# Patient Record
Sex: Male | Born: 1960 | Hispanic: No | Marital: Married | Smoking: Never smoker
Health system: Southern US, Community
[De-identification: ages and names within clinical notes are randomized; demographics above are authoritative.]

## PROBLEM LIST (undated history)

## (undated) DIAGNOSIS — D179 Benign lipomatous neoplasm, unspecified: Secondary | ICD-10-CM

## (undated) DIAGNOSIS — R972 Elevated prostate specific antigen [PSA]: Secondary | ICD-10-CM

## (undated) DIAGNOSIS — I10 Essential (primary) hypertension: Secondary | ICD-10-CM

## (undated) HISTORY — DX: Essential (primary) hypertension: I10

## (undated) HISTORY — PX: PROSTATE BIOPSY: SHX241

## (undated) HISTORY — DX: Elevated prostate specific antigen (PSA): R97.20

## (undated) HISTORY — DX: Benign lipomatous neoplasm, unspecified: D17.9

---
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
## 2009-05-08 ENCOUNTER — Ambulatory Visit: Payer: Self-pay | Admitting: Internal Medicine

## 2009-05-15 ENCOUNTER — Ambulatory Visit: Payer: Self-pay | Admitting: Internal Medicine

## 2009-05-18 ENCOUNTER — Telehealth: Payer: Self-pay | Admitting: Internal Medicine

## 2009-05-21 LAB — CONVERTED CEMR LAB
ALT: 22 units/L (ref 0–53)
AST: 24 units/L (ref 0–37)
Basophils Relative: 0.5 % (ref 0.0–3.0)
Chloride: 106 meq/L (ref 96–112)
Creatinine, Ser: 0.9 mg/dL (ref 0.4–1.5)
Eosinophils Absolute: 0.1 10*3/uL (ref 0.0–0.7)
Eosinophils Relative: 3.3 % (ref 0.0–5.0)
GFR calc non Af Amer: 95.68 mL/min (ref >60)
HDL: 37.5 mg/dL — ABNORMAL LOW (ref >39.00)
LDL Cholesterol: 127 mg/dL — ABNORMAL HIGH (ref 0–99)
Lymphocytes Relative: 29.8 % (ref 12.0–46.0)
Neutrophils Relative %: 58.3 % (ref 43.0–77.0)
PSA: 8.2 ng/mL — ABNORMAL HIGH (ref 0.10–4.00)
Platelets: 244 10*3/uL (ref 150.0–400.0)
Potassium: 4.1 meq/L (ref 3.5–5.1)
RBC: 4.66 M/uL (ref 4.22–5.81)
TSH: 0.96 microintl units/mL (ref 0.35–5.50)
VLDL: 15.8 mg/dL (ref 0.0–40.0)
WBC: 4.1 10*3/uL — ABNORMAL LOW (ref 4.5–10.5)

## 2009-06-15 ENCOUNTER — Encounter: Payer: Self-pay | Admitting: Internal Medicine

## 2009-07-10 ENCOUNTER — Encounter: Payer: Self-pay | Admitting: Internal Medicine

## 2009-07-17 ENCOUNTER — Encounter: Payer: Self-pay | Admitting: Internal Medicine

## 2009-08-31 ENCOUNTER — Telehealth: Payer: Self-pay | Admitting: Internal Medicine

## 2009-09-10 ENCOUNTER — Encounter: Payer: Self-pay | Admitting: Internal Medicine

## 2009-09-18 ENCOUNTER — Encounter: Payer: Self-pay | Admitting: Internal Medicine

## 2009-10-16 ENCOUNTER — Telehealth: Payer: Self-pay | Admitting: Internal Medicine

## 2010-03-21 ENCOUNTER — Encounter: Payer: Self-pay | Admitting: Internal Medicine

## 2010-05-21 NOTE — Consult Note (Signed)
Summary: planning a bx   [facility]   Imported By: Lanelle Bal 06/25/2009 10:05:30  _____________________________________________________________________  External Attachment:    Type:   Image     Comment:   External Document

## 2010-05-21 NOTE — Progress Notes (Signed)
  Phone Note Outgoing Call Call back at [phone]   Summary of Call: labs normal except for elevated PSA [facility] yesterday and today Cornie Mccomber E. Inigo Lantigua MD  May 18, 2009 11:35 AM   Follow-up for Phone Call        labs discussed with patient we agreed to do a urology referral most likely they will repeat the PSA and take it from there Plan:  Send the patient a copy of his labs arrange urology referral  Follow-up by: [facility] E. Davarion Cuffee MD,  May 21, 2009 9:00 AM  New Problems: PSA, INCREASED (ICD-790.93)   New Problems: PSA, INCREASED (ICD-790.93)

## 2010-05-21 NOTE — Letter (Signed)
Summary: [facility]  [facility]   Imported By: Lanelle Bal 09/06/2009 12:49:08  _____________________________________________________________________  External Attachment:    Type:   Image     Comment:   External Document

## 2010-05-21 NOTE — Letter (Signed)
Summary: RTC in 6 mo for PSA DRE[facility]  [facility]   Imported By: Lanelle Bal 09/26/2009 12:59:57  _____________________________________________________________________  External Attachment:    Type:   Image     Comment:   External Document

## 2010-05-21 NOTE — Op Note (Signed)
Summary: Prostate Needle Biopsy/[facility]  Prostate Needle Biopsy/[facility]   Imported By: Lanelle Bal 09/22/2009 10:59:43  _____________________________________________________________________  External Attachment:    Type:   Image     Comment:   External Document

## 2010-05-21 NOTE — Assessment & Plan Note (Signed)
Summary: NEW TO EST//PH   Vital Signs:  Patient profile:   50 year old male Height:      74 inches Weight:      212.8 pounds BMI:     27.42 Pulse rate:   72 / minute BP sitting:   140 / 90  Vitals Entered By: Shary Decamp (May 08, 2009 12:56 PM) CC: new pt cpx Is Patient Diabetic? No   History of Present Illness: CPX last physical 3 years ago aprox. 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 CV:  Denies chest pain or discomfort and swelling of feet. Resp:  Denies cough and shortness of breath. GI:  Denies bloody stools, diarrhea, nausea, and vomiting. GU:  Denies dysuria, hematuria, urinary frequency, and urinary hesitancy. Psych:  stress ok .  Physical Exam  General:  alert, well-developed, and well-nourished.   Neck:  no masses, no thyromegaly, and normal carotid upstroke.   Lungs:  normal respiratory effort, no intercostal retractions, no accessory muscle use, and normal breath sounds.   Heart:  normal rate, regular rhythm, no murmur, and no gallop.   Abdomen:  soft, non-tender, no distention, and no masses.   Rectal:  No external abnormalities noted. Normal sphincter tone. No  rectal masses or tenderness. Hemoccult negative Prostate:  Prostate gland firm and smooth, no enlargement, nodularity, tenderness, mass, asymmetry or induration. Extremities:  no edema Psych:  Cognition and judgment appear intact. Alert and cooperative with normal attention span and concentration.    Impression & Recommendations:  Problem # 1:  ROUTINE GENERAL MEDICAL EXAM@HEALTH  CARE FACL (ICD-V70.0)  Td today flu shot : benefits  and side effects discussed.  Had one today family history unknown but will go ahead and screen  the patient with a PSA and a iFOB EKG nsr  BP 140/90 when he arrived to the office, I rechecked and  obtained 120/85 doing well, continue with his healthy lifestyle. Come back  once a year  Orders: EKG w/ Interpretation (93000)  Problem # 2:   addendum occ  difficulty with erections samples of Viagra 50 mg one or two a day a prescription was also provided side effects discussed. If so desired we can try a different medicine later on  Complete Medication List: 1)  Viagra 100 Mg Tabs (Sildenafil citrate) .Marland Kitchen.. 1 by mouth once daily as needed  Other Orders: Tdap => 49yrs IM (62130) Admin 1st Vaccine (86578)  Patient Instructions: 1)  came back fasting: 2)  CBC FLP TSH AST ALT BMP PSA  3)  Dx V70 4)  Please schedule a follow-up appointment in 1 year.  Prescriptions: VIAGRA 100 MG TABS (SILDENAFIL CITRATE) 1 by mouth once daily as needed  #  10 x 3   Entered and Authorized by:   Nolon Rod. Tanji Storrs MD   Signed by:   Nolon Rod. Unknown Schleyer MD on 05/08/2009   Method used:   Print then Give to Patient   RxID:   [id number]    Immunizations Administered:  Tetanus Vaccine:    Vaccine Type: Tdap    Site: left deltoid    Dose: 0.5 ml    Route: IM    Given by: Shary Decamp    Exp. Date: 11/04/2010    Lot #: [id number]  Not Administered:    Influenza Vaccine not given due to: declined

## 2010-05-21 NOTE — Progress Notes (Signed)
Summary: checking on the patient  Phone Note Outgoing Call   Summary of Call: two days ago  I called the patient and left a message, I was checking on him and see if he has questions Aeralyn Barna E. Ajeenah Heiny MD  October 16, 2009 8:37 AM

## 2010-05-21 NOTE — Letter (Signed)
Summary: [facility]  [facility]   Imported By: Lanelle Bal 03/29/2010 13:57:49  _____________________________________________________________________  External Attachment:    Type:   Image     Comment:   External Document

## 2010-05-21 NOTE — Progress Notes (Signed)
Summary: bx scheduled for 5/23  ---- Converted from flag ---- ---- 08/31/2009 9:29 AM, Chaynce Schafer E. Elyn Krogh MD wrote: Norman Herrlich, please call urology, get the results of the second prostate biopsy ------------------------------ requested from [facility]............ Shary Decamp  Aug 31, 2009 10:32 AM f/u bx is scheduled for 09/10/09......... Shary Decamp  Aug 31, 2009 1:51 PM

## 2010-09-24 HISTORY — PX: CLOSED MANIPULATION SHOULDER: SUR205

## 2011-01-14 ENCOUNTER — Ambulatory Visit (INDEPENDENT_AMBULATORY_CARE_PROVIDER_SITE_OTHER): Payer: Self-pay | Admitting: Internal Medicine

## 2011-01-14 ENCOUNTER — Encounter: Payer: Self-pay | Admitting: Internal Medicine

## 2011-01-14 DIAGNOSIS — R079 Chest pain, unspecified: Secondary | ICD-10-CM

## 2011-01-14 DIAGNOSIS — Z136 Encounter for screening for cardiovascular disorders: Secondary | ICD-10-CM

## 2011-01-14 DIAGNOSIS — R972 Elevated prostate specific antigen [PSA]: Secondary | ICD-10-CM

## 2011-01-14 NOTE — Assessment & Plan Note (Addendum)
Chest pain with typical and atypical features. The fact that the pain never goes away and  is somehow reproducible goes against coronary artery disease however it is located on the left and radiates to the arm. He is a nonsmoker, unknown family history, last LDL a year ago 127. EKG today : T wave inversion on V2. Plan: Labs, chest x-ray, d-dimer  stress test this week ER if symptoms severe Aspirin 81 mg once a day EKG and plan discuss with cardiology

## 2011-01-14 NOTE — Patient Instructions (Signed)
Aspirin 81 mg once daily ER he had increased pain, difficulty breathing or pain with exertion.

## 2011-01-14 NOTE — Progress Notes (Signed)
  Subjective:    Patient ID: Nicholas Carrillo, male    DOB: Dec 02, 1960, 50 y.o.   MRN: [id number]  HPI 2 weeks history of a steady  chest pain, located at the left upper quadrant of the chest, pressure-like, somehow radiates to the left arm, the pain never goes away, usually increased during times of emotional distress. He reports that for the last 2 weeks he has been very anxious. No associated nausea or diaphoresis, not worse by moving the torso or the left arm.  Past Medical History  Diagnosis Date  . Increased prostate specific antigen (PSA) velocity     prostate BX 3-11 (atypical cells) and 08-2009 negative   Past Surgical History  Procedure Date  . Hemangioma excision 1972    located at the thimus, had a sternotomy  . Knee surgery 1979    tumor not w/i the joint  . Nasal septum surgery 1980  . Prostate biopsy     x 2 ---> 2011      Review of Systems Denies any fever, cough, shortness of breath. No prolonged car or airplane trips although he takes flying lessons one hour twice a week, they are low altitude flights. Denies any pain in the legs or lower extremity swelling. No abdominal pain or GERD symptoms He used to run 3 times a week, he has stopped running 2 weeks ago as a precaution. We don't know if the chest pain is triggered by running.     Objective:   Physical Exam  Constitutional: He is oriented to person, place, and time. He appears well-developed and well-nourished. No distress.  HENT:  Head: Normocephalic and atraumatic.  Cardiovascular: Normal rate, regular rhythm and normal heart sounds.   No murmur heard.      Well-healed sternotomy scar, applying pressure at the left pectoral area makes the pain increase  Pulmonary/Chest: Effort normal and breath sounds normal. No respiratory distress. He has no wheezes. He has no rales.  Abdominal: Soft. He exhibits no distension. There is no tenderness. There is no rebound and no guarding.  Musculoskeletal: He exhibits no  edema.       Calves symmetric, no tender  Lymphadenopathy:    He has no cervical adenopathy.  Neurological: He is alert and oriented to person, place, and time.  Skin: He is not diaphoretic.  Psychiatric: He has a normal mood and affect. His behavior is normal. Judgment and thought content normal.          Assessment & Plan:

## 2011-01-15 ENCOUNTER — Ambulatory Visit (INDEPENDENT_AMBULATORY_CARE_PROVIDER_SITE_OTHER)
Admission: RE | Admit: 2011-01-15 | Discharge: 2011-01-15 | Disposition: A | Discharge status: Home / Self Care | Payer: Self-pay | Source: Ambulatory Visit | Attending: Internal Medicine | Admitting: Internal Medicine

## 2011-01-15 DIAGNOSIS — R079 Chest pain, unspecified: Secondary | ICD-10-CM

## 2011-01-15 LAB — CBC WITH DIFFERENTIAL/PLATELET
Basophils Relative: 1.8 % (ref 0.0–3.0)
Eosinophils Absolute: 0.1 10*3/uL (ref 0.0–0.7)
Eosinophils Relative: 4.1 % (ref 0.0–5.0)
Hemoglobin: 14.6 g/dL (ref 13.0–17.0)
Lymphocytes Relative: 51.8 % — ABNORMAL HIGH (ref 12.0–46.0)
MCHC: 33.9 g/dL (ref 30.0–36.0)
Monocytes Relative: 12 % (ref 3.0–12.0)
Neutro Abs: 0.9 10*3/uL — ABNORMAL LOW (ref 1.4–7.7)
Neutrophils Relative %: 30.3 % — ABNORMAL LOW (ref 43.0–77.0)
RBC: 4.73 Mil/uL (ref 4.22–5.81)
WBC: 3.1 10*3/uL — ABNORMAL LOW (ref 4.5–10.5)

## 2011-01-15 LAB — BASIC METABOLIC PANEL
CO2: 34 mEq/L — ABNORMAL HIGH (ref 19–32)
Calcium: 9.3 mg/dL (ref 8.4–10.5)
Creatinine, Ser: 1 mg/dL (ref 0.4–1.5)
Sodium: 145 mEq/L (ref 135–145)

## 2011-01-16 ENCOUNTER — Encounter (HOSPITAL_COMMUNITY): Payer: Self-pay | Admitting: Radiology

## 2011-01-27 ENCOUNTER — Ambulatory Visit (HOSPITAL_COMMUNITY): Payer: Self-pay | Attending: Internal Medicine | Admitting: Radiology

## 2011-01-27 DIAGNOSIS — R0789 Other chest pain: Secondary | ICD-10-CM

## 2011-01-27 DIAGNOSIS — R079 Chest pain, unspecified: Secondary | ICD-10-CM | POA: Insufficient documentation

## 2011-01-27 MED ORDER — TECHNETIUM TC 99M TETROFOSMIN IV KIT
33.0000 | PACK | Freq: Once | INTRAVENOUS | Status: AC | PRN
  Administered 2011-01-27: 33 via INTRAVENOUS

## 2011-01-27 MED ORDER — TECHNETIUM TC 99M TETROFOSMIN IV KIT
11.0000 | PACK | Freq: Once | INTRAVENOUS | Status: AC | PRN
  Administered 2011-01-27: 11 via INTRAVENOUS

## 2011-01-27 NOTE — Progress Notes (Signed)
[facility] 3 NUCLEAR MED [address] [place] [address] [phone]  Cardiology Nuclear Med Study  Nicholas Carrillo is a 50 y.o. male [id number] [birth year]   Nuclear Med Background Indication for Stress Test:  Evaluation for Ischemia and Abnormal EKG History:  No previous documented CAD Cardiac Risk Factors: No Risk Factors; Adopted  Symptoms:  Chest Pain/Pressure.  (last episode of chest discomfort was on 01/24/11)   Nuclear Pre-Procedure Caffeine/Decaff Intake:  None NPO After: 7:00pm   Lungs:  Clear.   IV 0.9% NS with Angio Cath:  20g  IV Site: R Antecubital  IV Started by:  Stanton Kidney, EMT-P  Chest Size (in):  43 Cup Size: n/a  Height: 6\' 2"  (1.88 m)  Weight:  206 lb (93.441 kg)  BMI:  Body mass index is 26.45 kg/(m^2). Tech Comments:  NA    Nuclear Med Study 1 or 2 day study: 1 day  Stress Test Type:  Stress  Reading MD: Charlton Haws, MD  Order Authorizing Provider:  Willow Ora, MD  Resting Radionuclide: Technetium 42m Tetrofosmin  Resting Radionuclide Dose: 11.0 mCi   Stress Radionuclide:  Technetium 89m Tetrofosmin  Stress Radionuclide Dose: 33.0 mCi           Stress Protocol Rest HR: 59 Stress HR: 184  Rest BP: 126/97 Stress BP: 181/83  Exercise Time (min): 12:00 METS: 13.7   Predicted Max HR: 171 bpm % Max HR: 107.6 bpm Rate Pressure Product: 21308   Dose of Adenosine (mg):  n/a Dose of Lexiscan: n/a mg  Dose of Atropine (mg): n/a Dose of Dobutamine: n/a mcg/kg/min (at max HR)  Stress Test Technologist: Smiley Houseman, CMA-N  Nuclear Technologist:  Domenic Polite, CNMT     Rest Procedure:  Myocardial perfusion imaging was performed at rest 45 minutes following the intravenous administration of Technetium 73m Tetrofosmin.  Rest ECG: No acute changes.  Stress Procedure:  The patient exercised for twelve minutes on the treadmill utilizing the Bruce protocol.  The patient stopped due to fatigue and denied any chest pain.  There were  no diagnostic ST-T wave changes.  Technetium 67m Tetrofosmin was injected at peak exercise and myocardial perfusion imaging was performed after a brief delay.  Stress ECG: No significant change from baseline ECG  QPS Raw Data Images:  Normal; no motion artifact; normal heart/lung ratio. Stress Images:  Normal homogeneous uptake in all areas of the myocardium. Rest Images:  Normal homogeneous uptake in all areas of the myocardium. Subtraction (SDS):  Normal Transient Ischemic Dilatation (Normal <1.22):  0.77 Lung/Heart Ratio (Normal <0.45):  0.34  Quantitative Gated Spect Images QGS EDV:  95 ml QGS ESV:  36 ml QGS cine images:  NL LV Function; NL Wall Motion QGS EF: 62%  Impression Exercise Capacity:  Excellent exercise capacity. BP Response:  Normal blood pressure response. Clinical Symptoms:  No chest pain. ECG Impression:  No significant ST segment change suggestive of ischemia. Comparison with Prior Nuclear Study: No previous nuclear study performed  Overall Impression:  Normal stress nuclear study.     Charlton Haws

## 2011-01-29 ENCOUNTER — Telehealth: Payer: Self-pay | Admitting: Internal Medicine

## 2011-01-29 DIAGNOSIS — R9389 Abnormal findings on diagnostic imaging of other specified body structures: Secondary | ICD-10-CM

## 2011-01-29 NOTE — Telephone Encounter (Signed)
stress test was negative, chest x-ray show elevated left diaphragm. I discussed the case with pulmonary, they recommended a CT of the chest to distinguish between a paralyzed diaphragm vs. a diaphragmatic hernia. If it is a hernia he will be referred to Dr. Daphine Deutscher , Gen. Surgery,  for possible correction. I left a detailed message, will scheduled a CT .

## 2011-01-30 NOTE — Telephone Encounter (Signed)
CT has been scheduled 

## 2011-01-31 ENCOUNTER — Telehealth: Payer: Self-pay | Admitting: Internal Medicine

## 2011-01-31 ENCOUNTER — Other Ambulatory Visit: Payer: Self-pay

## 2011-01-31 NOTE — Telephone Encounter (Signed)
Per call from Edward Qualia CT, patient was a Noshow for Ct today, 01-31-11.  When contacted by Okey Dupre, patient stated he hasn't seen Dr. Drue Novel in a month, would not reschedule the Ct, states he wants to talk to Dr. Drue Novel about this first.

## 2011-01-31 NOTE — Telephone Encounter (Signed)
Spoke with the patient, explained the results of that chest x-ray and stress test. Chest pain has decreased. We agreed that he will reschedule the CT  for next week. Please arrange . He will let me know if the chest pain resurface. He thinks it was anxiety.

## 2011-02-03 NOTE — Telephone Encounter (Signed)
CT has been rescheduled for 02-11-11 (per his request).  Patient is aware of appt.

## 2011-02-11 ENCOUNTER — Ambulatory Visit (INDEPENDENT_AMBULATORY_CARE_PROVIDER_SITE_OTHER)
Admission: RE | Admit: 2011-02-11 | Discharge: 2011-02-11 | Disposition: A | Discharge status: Home / Self Care | Payer: BC Managed Care – PPO | Source: Ambulatory Visit | Attending: Internal Medicine | Admitting: Internal Medicine

## 2011-02-11 DIAGNOSIS — R918 Other nonspecific abnormal finding of lung field: Secondary | ICD-10-CM

## 2011-02-11 DIAGNOSIS — R9389 Abnormal findings on diagnostic imaging of other specified body structures: Secondary | ICD-10-CM

## 2011-02-12 ENCOUNTER — Telehealth: Payer: Self-pay | Admitting: Internal Medicine

## 2011-02-12 NOTE — Telephone Encounter (Signed)
I called the patient yesterday and again today. 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 He has 2 small nodules, I recommend a CT in 12 months because he is "low risk" for bronchogenic carcinoma, he's not a smoker.

## 2011-11-12 ENCOUNTER — Telehealth: Payer: Self-pay

## 2011-11-12 NOTE — Telephone Encounter (Signed)
Misty Stanley with [facility] is checking to see if request has been received please contact to let them know pt ref# [id number] [phone]

## 2011-12-15 ENCOUNTER — Ambulatory Visit (INDEPENDENT_AMBULATORY_CARE_PROVIDER_SITE_OTHER): Payer: BC Managed Care – PPO | Admitting: Internal Medicine

## 2011-12-15 ENCOUNTER — Encounter: Payer: Self-pay | Admitting: Internal Medicine

## 2011-12-15 VITALS — BP 122/78 | HR 63 | Temp 98.0°F | Ht 75.0 in | Wt 202.0 lb

## 2011-12-15 DIAGNOSIS — Z0000 Encounter for general adult medical examination without abnormal findings: Secondary | ICD-10-CM

## 2011-12-15 DIAGNOSIS — R9389 Abnormal findings on diagnostic imaging of other specified body structures: Secondary | ICD-10-CM | POA: Insufficient documentation

## 2011-12-15 LAB — HEPATIC FUNCTION PANEL
ALT: 20 U/L (ref 0–53)
AST: 17 U/L (ref 0–37)
Albumin: 4.1 g/dL (ref 3.5–5.2)
Total Bilirubin: 1 mg/dL (ref 0.3–1.2)

## 2011-12-15 LAB — LIPID PANEL
HDL: 40.7 mg/dL (ref >39.00)
Triglycerides: 120 mg/dL (ref 0.0–149.0)
VLDL: 24 mg/dL (ref 0.0–40.0)

## 2011-12-15 LAB — LDL CHOLESTEROL, DIRECT: Direct LDL: 146.4 mg/dL

## 2011-12-15 LAB — TSH: TSH: 1.45 u[IU]/mL (ref 0.35–5.50)

## 2011-12-15 MED ORDER — SILDENAFIL CITRATE 100 MG PO TABS: 100.0000 mg | ORAL_TABLET | Freq: Every day | ORAL | Status: DC | PRN

## 2011-12-15 NOTE — Assessment & Plan Note (Signed)
Report reviewed with the patient, plan is to repeat CT in one year (October 2013)

## 2011-12-15 NOTE — Assessment & Plan Note (Signed)
Td 2011  We discussed a colonoscopy, he likes to go ahead and proceed with that. Will refer him to GI. PSA is followed by urology I recommend to continue with his healthy lifestyle. Return to the office in one year

## 2011-12-15 NOTE — Progress Notes (Signed)
  Subjective:    Patient ID: Nicholas Carrillo, male    DOB: [birth year], 51 y.o.   MRN: [id number]  HPI  CPX  Past medical history Increased PSA velocity,prostate BX 3-11 (atypical cells) and 08-2009 negative  Chest pain, 10- 2012, negative stress test Abnormal CT of the chest  01-2011, repeat in one year.  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   Cardiovascular: Negative for chest pain, palpitations and leg swelling.  Gastrointestinal: Negative for abdominal pain and blood in stool.  Genitourinary: Negative for dysuria and hematuria.       Objective:   Physical Exam  General -- alert, well-developed, and well-nourished.   Neck --no thyromegaly , normal carotid pulse Lungs -- normal respiratory effort, no intercostal retractions, no accessory muscle use, and normal breath sounds.   Heart-- normal rate, regular rhythm, no murmur, and no gallop.   Abdomen--soft, non-tender, no distention, no masses, no HSM, no guarding, and no rigidity.   Extremities-- no pretibial edema bilaterally Neurologic-- alert & oriented X3 and strength normal in all extremities. Psych-- Cognition and judgment appear intact. Alert and cooperative with normal attention span and concentration.  not anxious appearing and not depressed appearing.       Assessment & Plan:

## 2011-12-16 ENCOUNTER — Encounter: Payer: Self-pay | Admitting: Internal Medicine

## 2011-12-17 ENCOUNTER — Encounter: Payer: Self-pay | Admitting: *Deleted

## 2012-01-12 ENCOUNTER — Telehealth: Payer: Self-pay | Admitting: Internal Medicine

## 2012-01-12 DIAGNOSIS — R911 Solitary pulmonary nodule: Secondary | ICD-10-CM

## 2012-01-12 NOTE — Telephone Encounter (Signed)
Dr. Drue Novel, patient calling today, he is trying to get new life insurance, and they are questioning the last CT chest performed on 02/11/11, which showed lung nodule.  Suggested to repeat in 1 year, but patient asking if we can order to be performed now in hopes of speeding up his new insurance request approval.    Also, as fyi, patient has chosen to cancel Screening Colonoscopy for this year, as he feels the insurance may then cause him delay or want results from that also.  Says he will reschedule for 2014.    Please advise.

## 2012-01-12 NOTE — Telephone Encounter (Signed)
No problem, it just enter a order for CT. Let pt know

## 2012-01-14 ENCOUNTER — Ambulatory Visit (INDEPENDENT_AMBULATORY_CARE_PROVIDER_SITE_OTHER)
Admission: RE | Admit: 2012-01-14 | Discharge: 2012-01-14 | Disposition: A | Discharge status: Home / Self Care | Payer: BC Managed Care – PPO | Source: Ambulatory Visit | Attending: Internal Medicine | Admitting: Internal Medicine

## 2012-01-14 DIAGNOSIS — R911 Solitary pulmonary nodule: Secondary | ICD-10-CM

## 2012-01-14 NOTE — Telephone Encounter (Signed)
Patient scheduled for CT Chest today, 01/14/12, with [facility], and patient is aware.

## 2012-01-15 ENCOUNTER — Encounter: Payer: Self-pay | Admitting: *Deleted

## 2012-02-05 ENCOUNTER — Encounter: Payer: BC Managed Care – PPO | Admitting: Internal Medicine

## 2012-03-11 ENCOUNTER — Encounter: Payer: Self-pay | Admitting: Internal Medicine

## 2012-05-05 ENCOUNTER — Encounter: Payer: BC Managed Care – PPO | Admitting: Internal Medicine

## 2012-08-11 ENCOUNTER — Encounter: Payer: Self-pay | Admitting: Internal Medicine

## 2012-09-10 ENCOUNTER — Encounter: Payer: Self-pay | Admitting: Internal Medicine

## 2012-09-10 ENCOUNTER — Ambulatory Visit (AMBULATORY_SURGERY_CENTER): Payer: BC Managed Care – PPO | Admitting: *Deleted

## 2012-09-10 VITALS — Ht 74.0 in | Wt 201.2 lb

## 2012-09-10 DIAGNOSIS — Z1211 Encounter for screening for malignant neoplasm of colon: Secondary | ICD-10-CM

## 2012-09-10 MED ORDER — NA SULFATE-K SULFATE-MG SULF 17.5-3.13-1.6 GM/177ML PO SOLN: ORAL | Status: DC

## 2012-09-22 ENCOUNTER — Encounter: Payer: Self-pay | Admitting: Internal Medicine

## 2012-09-22 ENCOUNTER — Ambulatory Visit (AMBULATORY_SURGERY_CENTER): Payer: BC Managed Care – PPO | Admitting: Internal Medicine

## 2012-09-22 VITALS — BP 123/85 | HR 73 | Temp 96.7°F | Resp 15 | Ht 74.0 in | Wt 201.0 lb

## 2012-09-22 DIAGNOSIS — Z1211 Encounter for screening for malignant neoplasm of colon: Secondary | ICD-10-CM

## 2012-09-22 DIAGNOSIS — D126 Benign neoplasm of colon, unspecified: Secondary | ICD-10-CM

## 2012-09-22 HISTORY — PX: COLONOSCOPY: SHX174

## 2012-09-22 MED ORDER — SODIUM CHLORIDE 0.9 % IV SOLN: 500.0000 mL | INTRAVENOUS | Status: DC

## 2012-09-22 NOTE — Progress Notes (Signed)
Called to room to assist during endoscopic procedure.  Patient ID and intended procedure confirmed with present staff. Received instructions for my participation in the procedure from the performing physician.  

## 2012-09-22 NOTE — Op Note (Signed)
[facility] [address] [place], [address]   COLONOSCOPY PROCEDURE REPORT  PATIENT: Nicholas Carrillo, Nicholas Carrillo  MR#: [id number] BIRTHDATE: [birth year] , 51  yrs. old GENDER: Male ENDOSCOPIST: Iva Boop, MD, [facility] REFERRED XB:JYNW Drue Novel, M.D. PROCEDURE DATE:  09/22/2012 PROCEDURE:   Colonoscopy with snare polypectomy ASA CLASS:   Class I INDICATIONS:average risk screening and first colonoscopy. MEDICATIONS: Propofol (Diprivan) 280 mg IV  DESCRIPTION OF PROCEDURE:   After the risks benefits and alternatives of the procedure were thoroughly explained, informed consent was obtained.  A digital rectal exam revealed no abnormalities of the rectum and A digital rectal exam revealed no prostatic nodules.   The LB [id number]  endoscope was introduced through the anus and advanced to the cecum, which was identified by both the appendix and ileocecal valve. No adverse events experienced.   The quality of the prep was excellent using Suprep  The instrument was then slowly withdrawn as the colon was fully examined.      COLON FINDINGS: A diminutive sessile polyp was found in the sigmoid colon.  A polypectomy was performed with a cold snare.  The resection was complete and the polyp tissue was completely retrieved.   The colon mucosa was otherwise normal.   A right colon retroflexion was performed.  Retroflexed views revealed no abnormalities. The time to cecum=1 minutes 43 seconds.  Withdrawal time=11 minutes 12 seconds.  The scope was withdrawn and the procedure completed. COMPLICATIONS: There were no complications.  ENDOSCOPIC IMPRESSION: 1.   Diminutive sessile polyp was found in the sigmoid colon; polypectomy was performed with a cold snare 2.   The colon mucosa was otherwise normal - excellent prep  RECOMMENDATIONS: Timing of repeat colonoscopy will be determined by pathology findings.   eSigned:  Iva Boop, MD, [facility] 09/22/2012 11:40 AM   cc: The Patient  and Willow Ora, MD

## 2012-09-22 NOTE — Patient Instructions (Addendum)
One tiny polyp was removed from the colon. The exam was otherwise normal.  I will let you know pathology results and when to have another routine colonoscopy by mail.  I appreciate the opportunity to care for you. Iva Boop, MD, FACG   YOU HAD AN ENDOSCOPIC PROCEDURE TODAY AT THE [facility]: Refer to the procedure report that was given to you for any specific questions about what was found during the examination.  If the procedure report does not answer your questions, please call your gastroenterologist to clarify.  If you requested that your care partner not be given the details of your procedure findings, then the procedure report has been included in a sealed envelope for you to review at your convenience later.  YOU SHOULD EXPECT: Some feelings of bloating in the abdomen. Passage of more gas than usual.  Walking can help get rid of the air that was put into your GI tract during the procedure and reduce the bloating. If you had a lower endoscopy (such as a colonoscopy or flexible sigmoidoscopy) you may notice spotting of blood in your stool or on the toilet paper. If you underwent a bowel prep for your procedure, then you may not have a normal bowel movement for a few days.  DIET: Your first meal following the procedure should be a light meal and then it is ok to progress to your normal diet.  A half-sandwich or bowl of soup is an example of a good first meal.  Heavy or fried foods are harder to digest and may make you feel nauseous or bloated.  Likewise meals heavy in dairy and vegetables can cause extra gas to form and this can also increase the bloating.  Drink plenty of fluids but you should avoid alcoholic beverages for 24 hours.  ACTIVITY: Your care partner should take you home directly after the procedure.  You should plan to take it easy, moving slowly for the rest of the day.  You can resume normal activity the day after the procedure however you should NOT DRIVE or use  heavy machinery for 24 hours (because of the sedation medicines used during the test).    SYMPTOMS TO REPORT IMMEDIATELY: A gastroenterologist can be reached at any hour.  During normal business hours, 8:30 AM to 5:00 PM Monday through Friday, call [phone].  After hours and on weekends, please call the GI answering service at [phone] who will take a message and have the physician on call contact you.   Following lower endoscopy (colonoscopy or flexible sigmoidoscopy):  Excessive amounts of blood in the stool  Significant tenderness or worsening of abdominal pains  Swelling of the abdomen that is new, acute  Fever of 100F or higher  Following upper endoscopy (EGD)  Vomiting of blood or coffee ground material  New chest pain or pain under the shoulder blades  Painful or persistently difficult swallowing  New shortness of breath  Fever of 100F or higher  Black, tarry-looking stools  FOLLOW UP: If any biopsies were taken you will be contacted by phone or by letter within the next 1-3 weeks.  Call your gastroenterologist if you have not heard about the biopsies in 3 weeks.  Our staff will call the home number listed on your records the next business day following your procedure to check on you and address any questions or concerns that you may have at that time regarding the information given to you following your procedure. This is a  courtesy call and so if there is no answer at the home number and we have not heard from you through the emergency physician on call, we will assume that you have returned to your regular daily activities without incident.  SIGNATURES/CONFIDENTIALITY: You and/or your care partner have signed paperwork which will be entered into your electronic medical record.  These signatures attest to the fact that that the information above on your After Visit Summary has been reviewed and is understood.  Full responsibility of the confidentiality of this  discharge information lies with you and/or your care-partner.   Information on polyps given to you today

## 2012-09-22 NOTE — Progress Notes (Signed)
Patient did not experience any of the following events: a burn prior to discharge; a fall within the facility; wrong site/side/patient/procedure/implant event; or a hospital transfer or hospital admission upon discharge from the facility. (G8907) Patient with preoperative order for IV antibiotic SSI prophylaxis, antibiotic initiated on time. (G8916)  

## 2012-09-23 ENCOUNTER — Telehealth: Payer: Self-pay | Admitting: *Deleted

## 2012-09-23 NOTE — Telephone Encounter (Signed)
No identifier, left message, follow-up  

## 2012-09-27 ENCOUNTER — Encounter: Payer: Self-pay | Admitting: Internal Medicine

## 2012-09-27 NOTE — Progress Notes (Signed)
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

## 2015-04-10 ENCOUNTER — Telehealth: Payer: Self-pay | Admitting: Internal Medicine

## 2015-04-10 NOTE — Telephone Encounter (Signed)
Okay to re-establish, okay to put 2-30 minute appts together. Will not be able to refill until seen.

## 2015-04-10 NOTE — Telephone Encounter (Signed)
LM to notify pt he can re-est. OK to combine 2 59min slots

## 2015-04-10 NOTE — Telephone Encounter (Signed)
Pt called to see if he could get refill on meds for ED. Advised pt he has not been seen since 11/2011 and would (1) need to be approved by Dr. Larose Kells to re-establish care (2) need appt. Next new pt is at the end of march. Can I get him in for 30 in OV to re-est sooner (combine slots) if approved?

## 2015-04-13 ENCOUNTER — Encounter: Payer: Self-pay | Admitting: Internal Medicine

## 2015-04-13 ENCOUNTER — Other Ambulatory Visit: Payer: Self-pay

## 2015-04-13 ENCOUNTER — Ambulatory Visit (INDEPENDENT_AMBULATORY_CARE_PROVIDER_SITE_OTHER): Payer: BLUE CROSS/BLUE SHIELD | Admitting: Internal Medicine

## 2015-04-13 VITALS — BP 112/76 | HR 87 | Temp 97.4°F | Ht 74.0 in | Wt 207.4 lb

## 2015-04-13 DIAGNOSIS — Z0000 Encounter for general adult medical examination without abnormal findings: Secondary | ICD-10-CM

## 2015-04-13 DIAGNOSIS — I712 Thoracic aortic aneurysm, without rupture, unspecified: Secondary | ICD-10-CM

## 2015-04-13 DIAGNOSIS — Z09 Encounter for follow-up examination after completed treatment for conditions other than malignant neoplasm: Secondary | ICD-10-CM

## 2015-04-13 MED ORDER — SILDENAFIL CITRATE 20 MG PO TABS: 60.0000 mg | ORAL_TABLET | Freq: Every day | ORAL | Status: DC | PRN

## 2015-04-13 NOTE — Progress Notes (Signed)
Subjective:    Patient ID: Nicholas Carrillo, male    DOB: [birth year], 54 y.o.   MRN: [id number]  DOS:  04/13/2015 Type of visit - description : CPX, new pt Interval history: No concerns. Remains active, trying to eat healthy most of the time.    Review of Systems  Constitutional: No fever. No chills. No unexplained wt changes. No unusual sweats  HEENT: No dental problems, no ear discharge, no facial swelling, no voice changes. No eye discharge, no eye  redness , no  intolerance to light   Respiratory: No wheezing , no  difficulty breathing. No cough , no mucus production  Cardiovascular: No CP, no leg swelling , no  Palpitations  GI: no nausea, no vomiting, no diarrhea , no  abdominal pain.  No blood in the stools. No dysphagia, no odynophagia    Endocrine: No polyphagia, no polyuria , no polydipsia  GU: No dysuria, gross hematuria, difficulty urinating. No urinary urgency, no frequency.  Musculoskeletal: No joint swellings or unusual aches or pains  Skin: No change in the color of the skin, palor , no  Rash  Allergic, immunologic: No environmental allergies , no  food allergies  Neurological: No dizziness no  syncope. No headaches. No diplopia, no slurred, no slurred speech, no motor deficits, no facial  Numbness  Hematological: No enlarged lymph nodes, no easy bruising , no unusual bleedings  Psychiatry: No suicidal ideas, no hallucinations, no beavior problems, no confusion.  No unusual/severe anxiety, no depression   Past Medical History  Diagnosis Date  . Increased prostate specific antigen (PSA) velocity     prostate BX 3-11 (atypical cells) and 08-2009 negative    Past Surgical History  Procedure Laterality Date  . Hemangioma excision  1972    located at the thimus, had a sternotomy  . Knee surgery  1979    tumor not w/i the joint  . Nasal septum surgery  1980  . Prostate biopsy      x 2 ---> 2011  . Closed manipulation shoulder Right 09/24/10    Social  History   Social History  . Marital Status: Married    Spouse Name: N/A  . Number of Children: 2  . Years of Education: N/A   Occupational History  . lawyer    Social History Main Topics  . Smoking status: Never Smoker   . Smokeless tobacco: Never Used  . Alcohol Use: 3.0 oz/week    5 Glasses of wine per week  . Drug Use: No  . Sexual Activity: Not on file   Other Topics Concern  . Not on file   Social History Narrative   Unknown Family Hx-Adopted.   Occupation: Forensic psychologist   Married, 2 children   Diet--try to eat healthy   Regular Exercise---3x wk treadmill, walk for 40 minutes              Family History  Problem Relation Age of Onset  . Adopted: Yes       Medication List       This list is accurate as of: 04/13/15 11:59 PM.  Always use your most recent med list.               sildenafil 20 MG tablet  Commonly known as:  REVATIO  Take 3-4 tablets (60-80 mg total) by mouth daily as needed.           Objective:   Physical Exam BP 112/76 mmHg  Pulse 87  Temp(Src) 97.4 F (36.3 C) (Oral)  Ht 6\' 2"  (1.88 m)  Wt 207 lb 6 oz (94.065 kg)  BMI 26.61 kg/m2  SpO2 96% General:   Well developed, well nourished . NAD.  Neck: No  thyromegaly , normal carotid pulse HEENT:  Normocephalic . Face symmetric, atraumatic Lungs:  CTA B Normal respiratory effort, no intercostal retractions, no accessory muscle use. Heart: RRR,  no murmur.  No pretibial edema bilaterally  Abdomen:  Not distended, soft, non-tender. No rebound or rigidity.   Skin: Exposed areas without rash. Not pale. Not jaundice Neurologic:  alert & oriented X3.  Speech normal, gait appropriate for age and unassisted Strength symmetric and appropriate for age.  Psych: Cognition and judgment appear intact.  Cooperative with normal attention span and concentration.  Behavior appropriate. No anxious or depressed appearing.     Assessment & Plan:   Assessment Increased PSA, BX 06-2009:  atypical cells. BX again 08-2009 (-) Abnormal CT chest: --Nodules  : Stable per CT 2013, no further CTs if risk is low  --Elevated diaphragma, chronic --Mild ascending aortic aneurysm ED   Plan: [facility] call radiology about repeating a CT given aneurysm. Addendum-- spoke w/ Dr Jobe Igo regards Ao aneurysm , current guidelines suggest repeat a CTA or MRA (w/ contrast ideally ---> will set up and let pt know).  J P 04-18-2015

## 2015-04-13 NOTE — Patient Instructions (Addendum)
BEFORE YOU LEAVE THE OFFICE:   GO TO THE FRONT DESK Schedule labs to be done within few days (fasting)    Schedule a complete physical exam to be done in one year  Please be fasting  We can also see you in the afternoon and check labs the next day

## 2015-04-13 NOTE — Progress Notes (Signed)
Pre visit review using our clinic review tool, if applicable. No additional management support is needed unless otherwise documented below in the visit note. 

## 2015-04-13 NOTE — Assessment & Plan Note (Addendum)
Td 2011  Flu shot-- declined CCS: colonoscopy 2014, next 10 years per GI letter PSA is followed by urology (no records) I recommend to continue with his healthy lifestyle. Return to the office in one year

## 2015-04-17 ENCOUNTER — Other Ambulatory Visit (INDEPENDENT_AMBULATORY_CARE_PROVIDER_SITE_OTHER): Payer: BLUE CROSS/BLUE SHIELD

## 2015-04-17 DIAGNOSIS — Z0000 Encounter for general adult medical examination without abnormal findings: Secondary | ICD-10-CM

## 2015-04-17 LAB — COMPREHENSIVE METABOLIC PANEL
ALK PHOS: 39 U/L (ref 39–117)
ALT: 22 U/L (ref 0–53)
AST: 18 U/L (ref 0–37)
Albumin: 4.4 g/dL (ref 3.5–5.2)
BILIRUBIN TOTAL: 1 mg/dL (ref 0.2–1.2)
BUN: 14 mg/dL (ref 6–23)
CHLORIDE: 102 meq/L (ref 96–112)
CO2: 32 meq/L (ref 19–32)
CREATININE: 1.09 mg/dL (ref 0.40–1.50)
Calcium: 9.7 mg/dL (ref 8.4–10.5)
GFR: 74.92 mL/min (ref >60.00)
GLUCOSE: 105 mg/dL — AB (ref 70–99)
Potassium: 4.8 mEq/L (ref 3.5–5.1)
SODIUM: 141 meq/L (ref 135–145)
Total Protein: 7.1 g/dL (ref 6.0–8.3)

## 2015-04-17 LAB — CBC WITH DIFFERENTIAL/PLATELET
BASOS ABS: 0.1 10*3/uL (ref 0.0–0.1)
Basophils Relative: 0.9 % (ref 0.0–3.0)
EOS PCT: 3.3 % (ref 0.0–5.0)
Eosinophils Absolute: 0.2 10*3/uL (ref 0.0–0.7)
HEMATOCRIT: 47.6 % (ref 39.0–52.0)
HEMOGLOBIN: 16.2 g/dL (ref 13.0–17.0)
LYMPHS PCT: 26.3 % (ref 12.0–46.0)
Lymphs Abs: 1.5 10*3/uL (ref 0.7–4.0)
MCHC: 34 g/dL (ref 30.0–36.0)
MCV: 89.8 fl (ref 78.0–100.0)
MONOS PCT: 7 % (ref 3.0–12.0)
Monocytes Absolute: 0.4 10*3/uL (ref 0.1–1.0)
NEUTROS PCT: 62.5 % (ref 43.0–77.0)
Neutro Abs: 3.5 10*3/uL (ref 1.4–7.7)
Platelets: 287 10*3/uL (ref 150.0–400.0)
RBC: 5.3 Mil/uL (ref 4.22–5.81)
RDW: 13.3 % (ref 11.5–15.5)
WBC: 5.5 10*3/uL (ref 4.0–10.5)

## 2015-04-17 LAB — LIPID PANEL
CHOL/HDL RATIO: 6
Cholesterol: 241 mg/dL — ABNORMAL HIGH (ref 0–200)
HDL: 41.4 mg/dL (ref >39.00)
LDL Cholesterol: 170 mg/dL — ABNORMAL HIGH (ref 0–99)
NONHDL: 199.15
Triglycerides: 146 mg/dL (ref 0.0–149.0)
VLDL: 29.2 mg/dL (ref 0.0–40.0)

## 2015-04-17 LAB — TSH: TSH: 1.19 u[IU]/mL (ref 0.35–4.50)

## 2015-04-18 ENCOUNTER — Encounter: Payer: Self-pay | Admitting: Internal Medicine

## 2015-04-18 DIAGNOSIS — Z09 Encounter for follow-up examination after completed treatment for conditions other than malignant neoplasm: Secondary | ICD-10-CM | POA: Insufficient documentation

## 2015-04-18 NOTE — Assessment & Plan Note (Signed)
We'll call radiology about repeating a CT given aneurysm. Addendum-- spoke w/ Dr Jobe Igo regards Ao aneurysm , current guidelines suggest repeat a CTA or MRA (w/ contrast ideally ---> will set up and let pt know).  J P 04-18-2015

## 2015-04-19 ENCOUNTER — Ambulatory Visit (HOSPITAL_BASED_OUTPATIENT_CLINIC_OR_DEPARTMENT_OTHER)
Admission: RE | Admit: 2015-04-19 | Discharge: 2015-04-19 | Disposition: A | Discharge status: Home / Self Care | Payer: BLUE CROSS/BLUE SHIELD | Source: Ambulatory Visit | Attending: Internal Medicine | Admitting: Internal Medicine

## 2015-04-19 DIAGNOSIS — I712 Thoracic aortic aneurysm, without rupture, unspecified: Secondary | ICD-10-CM

## 2015-04-19 MED ORDER — IOHEXOL 350 MG/ML SOLN
100.0000 mL | Freq: Once | INTRAVENOUS | Status: AC | PRN
  Administered 2015-04-19: 100 mL via INTRAVENOUS

## 2015-12-18 ENCOUNTER — Encounter: Payer: Self-pay | Admitting: Internal Medicine

## 2015-12-18 ENCOUNTER — Ambulatory Visit (INDEPENDENT_AMBULATORY_CARE_PROVIDER_SITE_OTHER): Payer: BLUE CROSS/BLUE SHIELD | Admitting: Internal Medicine

## 2015-12-18 VITALS — BP 118/74 | HR 74 | Temp 98.1°F | Resp 14 | Ht 74.0 in | Wt 217.0 lb

## 2015-12-18 DIAGNOSIS — D179 Benign lipomatous neoplasm, unspecified: Secondary | ICD-10-CM

## 2015-12-18 NOTE — Progress Notes (Signed)
   Subjective:    Patient ID: Nicholas Carrillo, male    DOB: Dec 23, 1960, 55 y.o.   MRN: [id number]  DOS:  12/18/2015 Type of visit - description : Acute visit Interval history: Long history of lipomas,  lately the one @ the left arm seems to be bigger and the one at the nuchal area is bothering him when he is laying down reading.    Review of Systems  Denies any itching, bleeding or skin changes @ the lipomas  Past Medical History:  Diagnosis Date  . Increased prostate specific antigen (PSA) velocity    prostate BX 3-11 (atypical cells) and 08-2009 negative    Past Surgical History:  Procedure Laterality Date  . CLOSED MANIPULATION SHOULDER Right 09/24/10  . [place]   located at the thimus, had a sternotomy  . KNEE SURGERY  1979   tumor not w/i the joint  . NASAL SEPTUM SURGERY  1980  . PROSTATE BIOPSY     x 2 ---> 2011    Social History   Social History  . Marital status: Married    Spouse name: N/A  . Number of children: 2  . Years of education: N/A   Occupational History  . lawyer    Social History Main Topics  . Smoking status: Never Smoker  . Smokeless tobacco: Never Used  . Alcohol use 3.0 oz/week    5 Glasses of wine per week  . Drug use: No  . Sexual activity: Not on file   Other Topics Concern  . Not on file   Social History Narrative   Unknown Family Hx-Adopted.   Occupation: Attorney   Married, 2 children   Diet--try to eat healthy   Regular Exercise---3x wk treadmill, walk for 40 minutes                 Medication List       Accurate as of 12/18/15 11:59 PM. Always use your most recent med list.          sildenafil 20 MG tablet Commonly known as:  REVATIO Take 3-4 tablets (60-80 mg total) by mouth daily as needed.          Objective:   Physical Exam  Skin:      BP 118/74 (BP Location: Left Arm, Patient Position: Sitting, Cuff Size: Normal)   Pulse 74   Temp 98.1 F (36.7 C) (Oral)   Resp 14   Ht 6\' 2"   (1.88 m)   Wt 217 lb (98.4 kg)   SpO2 98%   BMI 27.86 kg/m      Assessment & Plan:   Assessment Increased PSA, BX 06-2009: atypical cells. BX again 08-2009 (-) Abnormal CT chest: --Nodules  : Stable per CT 2013, no further CTs if risk is low  --Elevated diaphragma, chronic --Mild ascending aortic aneurysm: CT 04/10/2015 stable. Lipomas, multiple, had 2 biopsies when he was a teenager, negative ED   PLAN: Lipomas: Multiple lipomas, the one in the left arm has increased in size according to the patient, they one at the nuchal area is bothersome. Refer to Gen. surgery for consideration of excision.

## 2015-12-18 NOTE — Progress Notes (Signed)
Pre visit review using our clinic review tool, if applicable. No additional management support is needed unless otherwise documented below in the visit note. 

## 2015-12-18 NOTE — Patient Instructions (Signed)
Will refer you to a surgeon

## 2015-12-19 NOTE — Assessment & Plan Note (Signed)
Lipomas: Multiple lipomas, the one in the left arm has increased in size according to the patient, they one at the nuchal area is bothersome. Refer to Gen. surgery for consideration of excision.

## 2016-01-01 DIAGNOSIS — D170 Benign lipomatous neoplasm of skin and subcutaneous tissue of head, face and neck: Secondary | ICD-10-CM | POA: Diagnosis not present

## 2016-01-01 DIAGNOSIS — D1720 Benign lipomatous neoplasm of skin and subcutaneous tissue of unspecified limb: Secondary | ICD-10-CM | POA: Diagnosis not present

## 2016-03-03 ENCOUNTER — Other Ambulatory Visit: Payer: Self-pay | Admitting: General Surgery

## 2016-03-03 DIAGNOSIS — D1722 Benign lipomatous neoplasm of skin and subcutaneous tissue of left arm: Secondary | ICD-10-CM | POA: Diagnosis not present

## 2016-03-03 DIAGNOSIS — D179 Benign lipomatous neoplasm, unspecified: Secondary | ICD-10-CM | POA: Diagnosis not present

## 2016-03-03 DIAGNOSIS — D1721 Benign lipomatous neoplasm of skin and subcutaneous tissue of right arm: Secondary | ICD-10-CM | POA: Diagnosis not present

## 2016-03-03 DIAGNOSIS — D170 Benign lipomatous neoplasm of skin and subcutaneous tissue of head, face and neck: Secondary | ICD-10-CM | POA: Diagnosis not present

## 2016-03-03 DIAGNOSIS — M7989 Other specified soft tissue disorders: Secondary | ICD-10-CM | POA: Diagnosis not present

## 2016-04-30 ENCOUNTER — Other Ambulatory Visit: Payer: Self-pay | Admitting: Internal Medicine

## 2016-05-01 ENCOUNTER — Other Ambulatory Visit: Payer: Self-pay | Admitting: Internal Medicine

## 2016-09-09 ENCOUNTER — Encounter: Payer: Self-pay | Admitting: Internal Medicine

## 2016-09-10 ENCOUNTER — Encounter: Payer: Self-pay | Admitting: Internal Medicine

## 2017-07-13 ENCOUNTER — Ambulatory Visit (INDEPENDENT_AMBULATORY_CARE_PROVIDER_SITE_OTHER): Payer: BLUE CROSS/BLUE SHIELD | Admitting: Internal Medicine

## 2017-07-13 ENCOUNTER — Encounter: Payer: Self-pay | Admitting: Internal Medicine

## 2017-07-13 VITALS — BP 124/66 | HR 79 | Temp 97.9°F | Resp 14 | Ht 74.0 in | Wt 186.5 lb

## 2017-07-13 DIAGNOSIS — R197 Diarrhea, unspecified: Secondary | ICD-10-CM

## 2017-07-13 DIAGNOSIS — Z125 Encounter for screening for malignant neoplasm of prostate: Secondary | ICD-10-CM

## 2017-07-13 DIAGNOSIS — R634 Abnormal weight loss: Secondary | ICD-10-CM | POA: Diagnosis not present

## 2017-07-13 DIAGNOSIS — Z0000 Encounter for general adult medical examination without abnormal findings: Secondary | ICD-10-CM

## 2017-07-13 NOTE — Patient Instructions (Signed)
GO TO THE LAB :  Get the blood work   Also please get containers bring samples of loose stools at your earliest [place] Schedule your next appointment for a checkup in 3 months

## 2017-07-13 NOTE — Progress Notes (Signed)
Pre visit review using our clinic review tool, if applicable. No additional management support is needed unless otherwise documented below in the visit note. 

## 2017-07-13 NOTE — Progress Notes (Signed)
Subjective:    Patient ID: Nicholas Carrillo, male    DOB: 08/12/1960, 57 y.o.   MRN: [id number]  DOS:  07/13/2017 Type of visit - description : cpx Interval history: We also discussed other issues: Went to [place] April 2018, he got sick with nausea, vomiting and diarrhea, self prescribed Cipro. He came back to the [place] and continue with diarrhea without abdominal pain, fever, chills or blood in the stools. He had several episodes a day and the stools range from watery to loose. The sx lasted at least 3 months, during that timeframe he lost 30 pounds. Eventually he visited [place] in July 2018 and a friend recommended Nitazoxanida that he got w/o a prescription and took it for several days. Since then, his diarrhea has improved, he now has loose stools maybe once a week but at other times his stools are normal.    Wt Readings from Last 3 Encounters:  07/13/17 186 lb 8 oz (84.6 kg)  12/18/15 217 lb (98.4 kg)  04/13/15 207 lb 6 oz (94.1 kg)      Review of Systems Denies chest pain, difficulty breathing, palpitation. + Stress, work-related, has teenager children.   Other than above, a 14 point review of systems is negative    Past Medical History:  Diagnosis Date  . Increased prostate specific antigen (PSA) velocity    prostate BX 3-11 (atypical cells) and 08-2009 negative  . Multiple lipomas     Past Surgical History:  Procedure Laterality Date  . CLOSED MANIPULATION SHOULDER Right 09/24/10  . [place]   located at the thimus, had a sternotomy  . KNEE SURGERY  1979   tumor not w/i the joint  . NASAL SEPTUM SURGERY  1980  . PROSTATE BIOPSY     x 2 ---> 2011    Social History   Socioeconomic History  . Marital status: Married    Spouse name: Not on file  . Number of children: 2  . Years of education: Not on file  . Highest education level: Not on file  Occupational History  . Occupation: Chief Executive Officer  Social Needs  . Financial resource strain: Not on  file  . Food insecurity:    Worry: Not on file    Inability: Not on file  . Transportation needs:    Medical: Not on file    Non-medical: Not on file  Tobacco Use  . Smoking status: Never Smoker  . Smokeless tobacco: Never Used  Substance and Sexual Activity  . Alcohol use: Yes    Alcohol/week: 3.0 oz    Types: 5 Glasses of wine per week  . Drug use: No  . Sexual activity: Not on file  Lifestyle  . Physical activity:    Days per week: Not on file    Minutes per session: Not on file  . Stress: Not on file  Relationships  . Social connections:    Talks on phone: Not on file    Gets together: Not on file    Attends religious service: Not on file    Active member of club or organization: Not on file    Attends meetings of clubs or organizations: Not on file    Relationship status: Not on file  . Intimate partner violence:    Fear of current or ex partner: Not on file    Emotionally abused: Not on file    Physically abused: Not on file    Forced sexual activity: Not on file  Other Topics Concern  . Not on file  Social History Narrative   Unknown Family Hx-Adopted.   Occupation: Forensic psychologist   Married, 2 children   Diet--try to eat healthy   Regular Exercise---3x wk treadmill, walk for 40 minutes           Family History  Adopted: Yes     Allergies as of 07/13/2017   No Known Allergies     Medication List        Accurate as of 07/13/17 11:59 PM. Always use your most recent med list.          sildenafil 20 MG tablet Commonly known as:  REVATIO Take 3-4 tablets by mouth daily as needed.          Objective:   Physical Exam BP 124/66 (BP Location: Right Arm, Patient Position: Sitting, Cuff Size: Small)   Pulse 79   Temp 97.9 F (36.6 C) (Oral)   Resp 14   Ht 6\' 2"  (1.88 m)   Wt 186 lb 8 oz (84.6 kg)   SpO2 96%   BMI 23.95 kg/m  General:   Well developed, well nourished . NAD.  Neck: No  thyromegaly  HEENT:  Normocephalic . Face symmetric,  atraumatic Lungs:  CTA B Normal respiratory effort, no intercostal retractions, no accessory muscle use. Heart: RRR,  no murmur.  No pretibial edema bilaterally  Abdomen:  Not distended, soft, non-tender. No rebound or rigidity.   Rectal: External abnormalities: none. Normal sphincter tone. No rectal masses or tenderness.  No stools found Prostate: Prostate gland firm and smooth,  prostate quite enlarged but soft, nontender, nonnodular.   Skin: Exposed areas without rash. Not pale. Not jaundice Neurologic:  alert & oriented X3.  Speech normal, gait appropriate for age and unassisted Strength symmetric and appropriate for age.  Psych: Cognition and judgment appear intact.  Cooperative with normal attention span and concentration.  Behavior appropriate. No anxious or depressed appearing.     Assessment & Plan:   Assessment Increased PSA, BX 06-2009: atypical cells. BX again 08-2009 (-) Abnormal CT chest: --Nodules  : Stable per CT 2013, no further CTs if risk is low  --Elevated diaphragma, chronic --Mild ascending aortic aneurysm: CT 04/10/2015 stable. Lipomas, multiple, had 2 biopsies when he was a teenager, negative ED   PLAN: Diarrhea: As described below, initially associated to 30 pound weight loss, symptoms much improved after he took an antibiotic.  Now he still has episodic diarrhea. Plan: In addition to routine labs will get a T3, T4, iron, ferritin, stools for culture, WBC and O&P. Mild ascending aortic aneurysm:  The third and last  CT was stable.  Will d/w cards, ? continue checking. RTC 3 months  Today, in addition to the physical exam I spent more than  20  min with the patient: >50% of the time counseling regards diarrhea, weight loss.

## 2017-07-13 NOTE — Assessment & Plan Note (Addendum)
-  Td 2011 ; hay yellow fever shots - CCS: colonoscopy 2014, next 10 years per GI letter - increased PSA, has not seen urology in a while, asx.  DRE today show large but soft, not nodular prostate.  Check a PSA.  Low threshold to refer to urology although pt is reluctant to further eval due to last bx being wnl -I recommend to continue with his healthy lifestyle. --labs: see orders

## 2017-07-14 LAB — LIPID PANEL
CHOLESTEROL: 155 mg/dL (ref 0–200)
HDL: 29 mg/dL — AB (ref >39.00)
LDL Cholesterol: 110 mg/dL — ABNORMAL HIGH (ref 0–99)
NonHDL: 126.42
TRIGLYCERIDES: 82 mg/dL (ref 0.0–149.0)
Total CHOL/HDL Ratio: 5
VLDL: 16.4 mg/dL (ref 0.0–40.0)

## 2017-07-14 LAB — COMPREHENSIVE METABOLIC PANEL
ALBUMIN: 4.2 g/dL (ref 3.5–5.2)
ALK PHOS: 41 U/L (ref 39–117)
ALT: 26 U/L (ref 0–53)
AST: 21 U/L (ref 0–37)
BUN: 18 mg/dL (ref 6–23)
CHLORIDE: 105 meq/L (ref 96–112)
CO2: 33 mEq/L — ABNORMAL HIGH (ref 19–32)
Calcium: 9.5 mg/dL (ref 8.4–10.5)
Creatinine, Ser: 0.96 mg/dL (ref 0.40–1.50)
GFR: 86.03 mL/min (ref >60.00)
Glucose, Bld: 132 mg/dL — ABNORMAL HIGH (ref 70–99)
POTASSIUM: 5.2 meq/L — AB (ref 3.5–5.1)
Sodium: 144 mEq/L (ref 135–145)
TOTAL PROTEIN: 7.1 g/dL (ref 6.0–8.3)
Total Bilirubin: 0.8 mg/dL (ref 0.2–1.2)

## 2017-07-14 LAB — CBC WITH DIFFERENTIAL/PLATELET
BASOS PCT: 1.2 % (ref 0.0–3.0)
Basophils Absolute: 0.1 10*3/uL (ref 0.0–0.1)
EOS PCT: 1 % (ref 0.0–5.0)
Eosinophils Absolute: 0.1 10*3/uL (ref 0.0–0.7)
HCT: 44.4 % (ref 39.0–52.0)
HEMOGLOBIN: 14.9 g/dL (ref 13.0–17.0)
Lymphocytes Relative: 22 % (ref 12.0–46.0)
Lymphs Abs: 1.4 10*3/uL (ref 0.7–4.0)
MCHC: 33.6 g/dL (ref 30.0–36.0)
MCV: 91.8 fl (ref 78.0–100.0)
MONO ABS: 0.5 10*3/uL (ref 0.1–1.0)
Monocytes Relative: 7.9 % (ref 3.0–12.0)
NEUTROS ABS: 4.4 10*3/uL (ref 1.4–7.7)
Neutrophils Relative %: 67.9 % (ref 43.0–77.0)
PLATELETS: 311 10*3/uL (ref 150.0–400.0)
RBC: 4.84 Mil/uL (ref 4.22–5.81)
RDW: 14.5 % (ref 11.5–15.5)
WBC: 6.5 10*3/uL (ref 4.0–10.5)

## 2017-07-14 LAB — PSA: PSA: 2.77 ng/mL (ref 0.10–4.00)

## 2017-07-14 LAB — TSH: TSH: 1.28 u[IU]/mL (ref 0.35–4.50)

## 2017-07-14 LAB — IRON: IRON: 76 ug/dL (ref 42–165)

## 2017-07-14 LAB — T4, FREE: Free T4: 1.02 ng/dL (ref 0.60–1.60)

## 2017-07-14 LAB — FERRITIN: FERRITIN: 81.9 ng/mL (ref 22.0–322.0)

## 2017-07-14 LAB — T3, FREE: T3, Free: 2.9 pg/mL (ref 2.3–4.2)

## 2017-07-14 NOTE — Assessment & Plan Note (Signed)
Diarrhea: As described below, initially associated to 30 pound weight loss, symptoms much improved after he took an antibiotic.  Now he still has episodic diarrhea. Plan: In addition to routine labs will get a T3, T4, iron, ferritin, stools for culture, WBC and O&P. Mild ascending aortic aneurysm:  The third and last  CT was stable.  Will d/w cards, ? continue checking. RTC 3 months

## 2017-07-20 ENCOUNTER — Telehealth: Payer: Self-pay | Admitting: Internal Medicine

## 2017-07-20 NOTE — Telephone Encounter (Signed)
Patient sent a message, I could not returned it so I called him: -Labs look good, no need for further testing -He will bring back the stool samples when he can and will keep the appointment 3 months from today. -Also, I asked cardiology regards the thoracic aorta CT follow-up, given his stability he recommend a CT in 3 years.  This was discussed with the patient, planning to get a CT by 2021 or 2022. Pt aware

## 2017-08-03 ENCOUNTER — Other Ambulatory Visit: Payer: BLUE CROSS/BLUE SHIELD

## 2017-08-03 DIAGNOSIS — R197 Diarrhea, unspecified: Secondary | ICD-10-CM

## 2017-08-06 LAB — FECAL LACTOFERRIN, QUANT
FECAL LACTOFERRIN: POSITIVE — AB
MICRO NUMBER:: 90460234
SPECIMEN QUALITY:: ADEQUATE

## 2017-08-06 LAB — STOOL CULTURE
MICRO NUMBER: 90460232
MICRO NUMBER:: 90460233
MICRO NUMBER:: 90460236
SHIGA RESULT: NOT DETECTED
SPECIMEN QUALITY: ADEQUATE
SPECIMEN QUALITY: ADEQUATE
SPECIMEN QUALITY: ADEQUATE

## 2017-08-06 LAB — OVA AND PARASITE EXAMINATION
SPECIMEN QUALITY: ADEQUATE
VKL: 90460235

## 2017-08-11 NOTE — Addendum Note (Signed)
Addended byDamita Dunnings D on: 08/11/2017 02:37 PM   Modules accepted: Orders

## 2017-10-14 ENCOUNTER — Ambulatory Visit: Payer: BLUE CROSS/BLUE SHIELD | Admitting: Internal Medicine

## 2017-10-16 ENCOUNTER — Encounter: Payer: Self-pay | Admitting: Internal Medicine

## 2017-10-16 ENCOUNTER — Ambulatory Visit (INDEPENDENT_AMBULATORY_CARE_PROVIDER_SITE_OTHER): Payer: BLUE CROSS/BLUE SHIELD | Admitting: Internal Medicine

## 2017-10-16 VITALS — BP 116/74 | HR 67 | Temp 98.1°F | Resp 16 | Ht 74.0 in | Wt 190.5 lb

## 2017-10-16 DIAGNOSIS — R197 Diarrhea, unspecified: Secondary | ICD-10-CM | POA: Diagnosis not present

## 2017-10-16 DIAGNOSIS — R634 Abnormal weight loss: Secondary | ICD-10-CM

## 2017-10-16 NOTE — Progress Notes (Signed)
Pre visit review using our clinic review tool, if applicable. No additional management support is needed unless otherwise documented below in the visit note. 

## 2017-10-16 NOTE — Progress Notes (Signed)
Subjective:    Patient ID: Nicholas Carrillo, male    DOB: 04-28-1960, 57 y.o.   MRN: [id number]  DOS:  10/16/2017 Type of visit - description : f/u  Interval history: Since the last office visit, diarrhea has significantly decreased. Appetite remains healthy. Potassium was a slightly high, not following a particularly low potassium diet.  Wt Readings from Last 3 Encounters:  10/16/17 190 lb 8 oz (86.4 kg)  07/13/17 186 lb 8 oz (84.6 kg)  12/18/15 217 lb (98.4 kg)      Review of Systems  Denies nausea, vomiting. No blood in the stools.  Past Medical History:  Diagnosis Date  . Increased prostate specific antigen (PSA) velocity    prostate BX 3-11 (atypical cells) and 08-2009 negative  . Multiple lipomas     Past Surgical History:  Procedure Laterality Date  . CLOSED MANIPULATION SHOULDER Right 09/24/10  . [place]   located at the thimus, had a sternotomy  . KNEE SURGERY  1979   tumor not w/i the joint  . NASAL SEPTUM SURGERY  1980  . PROSTATE BIOPSY     x 2 ---> 2011    Social History   Socioeconomic History  . Marital status: Married    Spouse name: Not on file  . Number of children: 2  . Years of education: Not on file  . Highest education level: Not on file  Occupational History  . Occupation: Chief Executive Officer  Social Needs  . Financial resource strain: Not on file  . Food insecurity:    Worry: Not on file    Inability: Not on file  . Transportation needs:    Medical: Not on file    Non-medical: Not on file  Tobacco Use  . Smoking status: Never Smoker  . Smokeless tobacco: Never Used  Substance and Sexual Activity  . Alcohol use: Yes    Alcohol/week: 3.0 oz    Types: 5 Glasses of wine per week  . Drug use: No  . Sexual activity: Not on file  Lifestyle  . Physical activity:    Days per week: Not on file    Minutes per session: Not on file  . Stress: Not on file  Relationships  . Social connections:    Talks on phone: Not on file     Gets together: Not on file    Attends religious service: Not on file    Active member of club or organization: Not on file    Attends meetings of clubs or organizations: Not on file    Relationship status: Not on file  . Intimate partner violence:    Fear of current or ex partner: Not on file    Emotionally abused: Not on file    Physically abused: Not on file    Forced sexual activity: Not on file  Other Topics Concern  . Not on file  Social History Narrative   Unknown Family Hx-Adopted.   Occupation: Attorney   Married, 2 children   Diet--try to eat healthy   Regular Exercise---3x wk treadmill, walk for 40 minutes            Allergies as of 10/16/2017   No Known Allergies     Medication List        Accurate as of 10/16/17 11:59 PM. Always use your most recent med list.          sildenafil 20 MG tablet Commonly known as:  REVATIO Take 3-4 tablets by  mouth daily as needed.          Objective:   Physical Exam BP 116/74 (BP Location: Left Arm, Patient Position: Sitting, Cuff Size: Small)   Pulse 67   Temp 98.1 F (36.7 C) (Oral)   Resp 16   Ht 6\' 2"  (1.88 m)   Wt 190 lb 8 oz (86.4 kg)   SpO2 98%   BMI 24.46 kg/m  General:   Well developed, NAD, see BMI.  HEENT:  Normocephalic . Face symmetric, atraumatic Abdomen:  Not distended, soft, non-tender. No rebound or rigidity.   Skin: Not pale. Not jaundice Neurologic:  alert & oriented X3.  Speech normal, gait appropriate for age and unassisted Psych--  Cognition and judgment appear intact.  Cooperative with normal attention span and concentration.  Behavior appropriate. No anxious or depressed appearing.     Assessment & Plan:   Assessment Increased PSA, BX 06-2009: atypical cells. BX again 08-2009 (-) Abnormal CT chest: --Nodules  : Stable per CT 2013, no further CTs if risk is low  --Elevated diaphragma, chronic --Mild ascending aortic aneurysm: CT 04/10/2015 stable. CT chest f/u 2021 Lipomas,  multiple, had 2 biopsies when he was a teenager, negative ED   PLAN: Diarrhea: Gradually improving without any intervention.  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  Lactoferrin was positive.  He has an appointment to see GI next month and they will decide if he needs further eval.  At this point I will not repeat any test. Mild hyperkalemia: Potassium was 5.1, he is not taking any medication that would account for that, I simply advised today a low potassium diet. Weight loss: Improved. RTC CPX 06-2017

## 2017-10-16 NOTE — Patient Instructions (Addendum)
GO TO THE FRONT DESK Schedule your next appointment for a  Physical by 06-2018      Potassium Content of Foods Potassium is a mineral found in many foods and drinks. It helps keep fluids and minerals balanced in your body and affects how steadily your heart beats. Potassium also helps control your blood pressure and keep your muscles and nervous system healthy. Certain health conditions and medicines may change the balance of potassium in your body. When this happens, you can help balance your level of potassium through the foods that you do or do not eat. Your health care provider or dietitian may recommend an amount of potassium that you should have each day. The following lists of foods provide the amount of potassium (in parentheses) per serving in each item. High in potassium The following foods and beverages have 200 mg or more of potassium per serving:  Apricots, 2 raw or 5 dry (200 mg).  Artichoke, 1 medium (345 mg).  Avocado, raw,  each (245 mg).  Banana, 1 medium (425 mg).  Beans, lima, or baked beans, canned,  cup (280 mg).  Beans, white, canned,  cup (595 mg).  Beef roast, 3 oz (320 mg).  Beef, ground, 3 oz (270 mg).  Beets, raw or cooked,  cup (260 mg).  Bran muffin, 2 oz (300 mg).  Broccoli,  cup (230 mg).  Brussels sprouts,  cup (250 mg).  Cantaloupe,  cup (215 mg).  Cereal, 100% bran,  cup (200-400 mg).  Cheeseburger, single, fast food, 1 each (225-400 mg).  Chicken, 3 oz (220 mg).  Clams, canned, 3 oz (535 mg).  Crab, 3 oz (225 mg).  Dates, 5 each (270 mg).  Dried beans and peas,  cup (300-475 mg).  Figs, dried, 2 each (260 mg).  Fish: halibut, tuna, cod, snapper, 3 oz (480 mg).  Fish: salmon, haddock, swordfish, perch, 3 oz (300 mg).  Fish, tuna, canned 3 oz (200 mg).  [place] fries, fast food, 3 oz (470 mg).  Granola with fruit and nuts,  cup (200 mg).  Grapefruit juice,  cup (200 mg).  Greens, beet,  cup (655  mg).  Honeydew melon,  cup (200 mg).  Kale, raw, 1 cup (300 mg).  Kiwi, 1 medium (240 mg).  Kohlrabi, rutabaga, parsnips,  cup (280 mg).  Lentils,  cup (365 mg).  Mango, 1 each (325 mg).  Milk, chocolate, 1 cup (420 mg).  Milk: nonfat, low-fat, whole, buttermilk, 1 cup (350-380 mg).  Molasses, 1 Tbsp (295 mg).  Mushrooms,  cup (280) mg.  Nectarine, 1 each (275 mg).  Nuts: almonds, peanuts, hazelnuts, [place], cashew, mixed, 1 oz (200 mg).  Nuts, pistachios, 1 oz (295 mg).  Orange, 1 each (240 mg).  Orange juice,  cup (235 mg).  Papaya, medium,  fruit (390 mg).  Peanut butter, chunky, 2 Tbsp (240 mg).  Peanut butter, smooth, 2 Tbsp (210 mg).  Pear, 1 medium (200 mg).  Pomegranate, 1 whole (400 mg).  Pomegranate juice,  cup (215 mg).  Pork, 3 oz (350 mg).  Potato chips, salted, 1 oz (465 mg).  Potato, baked with skin, 1 medium (925 mg).  Potatoes, boiled,  cup (255 mg).  Potatoes, mashed,  cup (330 mg).  Prune juice,  cup (370 mg).  Prunes, 5 each (305 mg).  Pudding, chocolate,  cup (230 mg).  Pumpkin, canned,  cup (250 mg).  Raisins, seedless,  cup (270 mg).  Seeds, sunflower or pumpkin, 1 oz (240 mg).  Soy  milk, 1 cup (300 mg).  Spinach,  cup (420 mg).  Spinach, canned,  cup (370 mg).  Sweet potato, baked with skin, 1 medium (450 mg).  Swiss chard,  cup (480 mg).  Tomato or vegetable juice,  cup (275 mg).  Tomato sauce or puree,  cup (400-550 mg).  Tomato, raw, 1 medium (290 mg).  Tomatoes, canned,  cup (200-300 mg).  [place], 3 oz (250 mg).  Wheat germ, 1 oz (250 mg).  Winter squash,  cup (250 mg).  Yogurt, plain or fruited, 6 oz (260-435 mg).  Zucchini,  cup (220 mg).  Moderate in potassium The following foods and beverages have 50-200 mg of potassium per serving:  Apple, 1 each (150 mg).  Apple juice,  cup (150 mg).  Applesauce,  cup (90 mg).  Apricot nectar,  cup (140 mg).  Asparagus,  small spears,  cup or 6 spears (155 mg).  Bagel, cinnamon raisin, 1 each (130 mg).  Bagel, egg or plain, 4 in., 1 each (70 mg).  Beans, green,  cup (90 mg).  Beans, yellow,  cup (190 mg).  Beer, regular, 12 oz (100 mg).  Beets, canned,  cup (125 mg).  Blackberries,  cup (115 mg).  Blueberries,  cup (60 mg).  Bread, whole wheat, 1 slice (70 mg).  Broccoli, raw,  cup (145 mg).  Cabbage,  cup (150 mg).  Carrots, cooked or raw,  cup (180 mg).  Cauliflower, raw,  cup (150 mg).  Celery, raw,  cup (155 mg).  Cereal, bran flakes, cup (120-150 mg).  Cheese, cottage,  cup (110 mg).  Cherries, 10 each (150 mg).  Chocolate, 1 oz bar (165 mg).  Coffee, brewed 6 oz (90 mg).  Corn,  cup or 1 ear (195 mg).  Cucumbers,  cup (80 mg).  Egg, large, 1 each (60 mg).  Eggplant,  cup (60 mg).  Endive, raw, cup (80 mg).  English muffin, 1 each (65 mg).  Fish, orange roughy, 3 oz (150 mg).  Frankfurter, beef or pork, 1 each (75 mg).  Fruit cocktail,  cup (115 mg).  Grape juice,  cup (170 mg).  Grapefruit,  fruit (175 mg).  Grapes,  cup (155 mg).  Greens: kale, turnip, collard,  cup (110-150 mg).  Ice cream or frozen yogurt, chocolate,  cup (175 mg).  Ice cream or frozen yogurt, vanilla,  cup (120-150 mg).  Lemons, limes, 1 each (80 mg).  Lettuce, all types, 1 cup (100 mg).  Mixed vegetables,  cup (150 mg).  Mushrooms, raw,  cup (110 mg).  Nuts: walnuts, pecans, or macadamia, 1 oz (125 mg).  Oatmeal,  cup (80 mg).  Okra,  cup (110 mg).  Onions, raw,  cup (120 mg).  Peach, 1 each (185 mg).  Peaches, canned,  cup (120 mg).  Pears, canned,  cup (120 mg).  Peas, green, frozen,  cup (90 mg).  Peppers, green,  cup (130 mg).  Peppers, red,  cup (160 mg).  Pineapple juice,  cup (165 mg).  Pineapple, fresh or canned,  cup (100 mg).  Plums, 1 each (105 mg).  Pudding, vanilla,  cup (150 mg).  Raspberries,  cup  (90 mg).  Rhubarb,  cup (115 mg).  Rice, wild,  cup (80 mg).  Shrimp, 3 oz (155 mg).  Spinach, raw, 1 cup (170 mg).  Strawberries,  cup (125 mg).  Summer squash  cup (175-200 mg).  Swiss chard, raw, 1 cup (135 mg).  Tangerines, 1 each (140 mg).  Tea, brewed,  6 oz (65 mg).  Turnips,  cup (140 mg).  Watermelon,  cup (85 mg).  Wine, red, table, 5 oz (180 mg).  Wine, white, table, 5 oz (100 mg).  Low in potassium The following foods and beverages have less than 50 mg of potassium per serving.  Bread, white, 1 slice (30 mg).  Carbonated beverages, 12 oz (less than 5 mg).  Cheese, 1 oz (20-30 mg).  Cranberries,  cup (45 mg).  Cranberry juice cocktail,  cup (20 mg).  Fats and oils, 1 Tbsp (less than 5 mg).  Hummus, 1 Tbsp (32 mg).  Nectar: papaya, mango, or pear,  cup (35 mg).  Rice, white or brown,  cup (50 mg).  Spaghetti or macaroni,  cup cooked (30 mg).  Tortilla, flour or corn, 1 each (50 mg).  Waffle, 4 in., 1 each (50 mg).  Water chestnuts,  cup (40 mg).  This information is not intended to replace advice given to you by your health care provider. Make sure you discuss any questions you have with your health care provider. Document Released: 11/19/2004 Document Revised: 09/13/2015 Document Reviewed: 03/04/2013 Elsevier Interactive Patient Education  [employer].

## 2017-10-17 NOTE — Assessment & Plan Note (Signed)
Diarrhea: Gradually improving without any intervention.  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  Lactoferrin was positive.  He has an appointment to see GI next month and they will decide if he needs further eval.  At this point I will not repeat any test. Mild hyperkalemia: Potassium was 5.1, he is not taking any medication that would account for that, I simply advised today a low potassium diet. Weight loss: Improved. RTC CPX 06-2017

## 2017-11-02 ENCOUNTER — Encounter: Payer: Self-pay | Admitting: Internal Medicine

## 2017-11-02 ENCOUNTER — Ambulatory Visit (INDEPENDENT_AMBULATORY_CARE_PROVIDER_SITE_OTHER): Payer: BLUE CROSS/BLUE SHIELD | Admitting: Internal Medicine

## 2017-11-02 VITALS — BP 124/72 | HR 76 | Ht 73.75 in | Wt 190.4 lb

## 2017-11-02 DIAGNOSIS — R197 Diarrhea, unspecified: Secondary | ICD-10-CM | POA: Diagnosis not present

## 2017-11-02 NOTE — Patient Instructions (Signed)
  Glad your doing better.    We will see you for your colonoscopy in 2024.    I appreciate the opportunity to care for you. Ronney Lion, [facility]

## 2017-11-02 NOTE — Progress Notes (Signed)
   Nicholas Carrillo 57 y.o. [birth year] [id number]  Assessment & Plan:   Encounter Diagnosis  Name Primary?  . Diarrhea of presumed infectious origin Yes    Not sure role of the Entameba hartmani - supposed to be non-pathogenic Whatever he had is resolved and I think it was responsible for the + lactoferrin. 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   Will observe and plan on routine repeat [facility] screen - colonoscopy - 2024 as planned.  He is comfortable with this plan and knows to contact me directly if he needs to.    Subjective:   Chief Complaint: Prior diarrhea positive fecal lactoferrin  HPI The patient is here for follow-up of diarrhea and a positive fecal lactoferrin.  He was in [place] and [place] in 2018, took Cipro for nausea vomiting and diarrhea when in [place], then went to [place] and he took nitazoxanide and diarrhea improved.  He was having some loose stools intermittently when he saw Dr. 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  He had a positive fecal lactoferrin.  Dr. Larose Carrillo wanted him to see me to make sure nothing else needed to be done and at this point the patient is asymptomatic.  He had a non-precancerous polyp in the sigmoid colon on a colonoscopy in 2014. No Known Allergies Current Meds  Medication Sig  . sildenafil (REVATIO) 20 MG tablet Take 3-4 tablets by mouth daily as needed.   Past Medical History:  Diagnosis Date  . Increased prostate specific antigen (PSA) velocity    prostate BX 3-11 (atypical cells) and 08-2009 negative  . Multiple lipomas    Past Surgical History:  Procedure Laterality Date  . CLOSED MANIPULATION SHOULDER Right 09/24/10  . COLONOSCOPY  09/22/2012   polyp BUT not pre-cancerous recall 2024  . [place]   located at the thimus, had a sternotomy  . KNEE SURGERY  1979   tumor not w/i the joint  . NASAL SEPTUM SURGERY  1980  . PROSTATE  BIOPSY     x 2 ---> 2011   Social History   Social History Narrative   Unknown Family Hx-Adopted.   Occupation: Forensic psychologist   Married, 2 children   Diet--try to eat healthy   Regular Exercise---3x wk treadmill, walk for 40 minutes   [place] doing charitable work often   EtOH 5/week, no smoking/substances      family history is not on file. He was adopted.   Review of Systems As above  Objective:   Physical Exam BP 124/72 (BP Location: Left Arm, Patient Position: Sitting, Cuff Size: Normal)   Pulse 76   Ht 6' 1.75" (1.873 m) Comment: height measured without shoes  Wt 190 lb 6 oz (86.4 kg)   BMI 24.61 kg/m  NAD  15 minutes time spent with patient > half in counseling coordination of care

## 2017-11-06 DIAGNOSIS — L218 Other seborrheic dermatitis: Secondary | ICD-10-CM | POA: Diagnosis not present

## 2017-11-06 DIAGNOSIS — Z1283 Encounter for screening for malignant neoplasm of skin: Secondary | ICD-10-CM | POA: Diagnosis not present

## 2017-11-06 DIAGNOSIS — D485 Neoplasm of uncertain behavior of skin: Secondary | ICD-10-CM | POA: Diagnosis not present

## 2017-11-06 DIAGNOSIS — D225 Melanocytic nevi of trunk: Secondary | ICD-10-CM | POA: Diagnosis not present

## 2017-11-06 DIAGNOSIS — B351 Tinea unguium: Secondary | ICD-10-CM | POA: Diagnosis not present

## 2018-05-20 ENCOUNTER — Other Ambulatory Visit: Payer: Self-pay | Admitting: Internal Medicine

## 2018-06-16 ENCOUNTER — Ambulatory Visit (INDEPENDENT_AMBULATORY_CARE_PROVIDER_SITE_OTHER): Payer: BLUE CROSS/BLUE SHIELD | Admitting: Internal Medicine

## 2018-06-16 ENCOUNTER — Encounter: Payer: Self-pay | Admitting: Internal Medicine

## 2018-06-16 VITALS — BP 134/70 | HR 80 | Temp 98.2°F | Resp 16 | Ht 73.75 in | Wt 197.2 lb

## 2018-06-16 DIAGNOSIS — M549 Dorsalgia, unspecified: Secondary | ICD-10-CM

## 2018-06-16 MED ORDER — PREDNISONE 10 MG PO TABS: ORAL_TABLET | ORAL | 0 refills | Status: DC

## 2018-06-16 MED ORDER — KETOROLAC TROMETHAMINE 60 MG/2ML IM SOLN
60.0000 mg | Freq: Once | INTRAMUSCULAR | Status: AC
  Administered 2018-06-16: 60 mg via INTRAMUSCULAR

## 2018-06-16 MED ORDER — CYCLOBENZAPRINE HCL 10 MG PO TABS: 10.0000 mg | ORAL_TABLET | Freq: Two times a day (BID) | ORAL | 0 refills | Status: DC | PRN

## 2018-06-16 MED ORDER — KETOROLAC TROMETHAMINE 10 MG PO TABS: 10.0000 mg | ORAL_TABLET | Freq: Four times a day (QID) | ORAL | 0 refills | Status: DC | PRN

## 2018-06-16 NOTE — Assessment & Plan Note (Signed)
Lumbalgia: Acute, following putting together a piece of furniture. Suspect a muscular issue, doubt radiculopathy although he has a slightly decreased ankle jerk. Plan: Prednisone, change from Robaxin to Flexeril, Toradol IM x1, Toradol p.o. GI precautions discussed Heat or cold Call if not gradually improving. Also, is due for a physical exam and a CT chest.  Encouraged to return to the office for CPX.

## 2018-06-16 NOTE — Progress Notes (Signed)
Subjective:    Patient ID: Nicholas Carrillo, male    DOB: 21-Oct-1960, 58 y.o.   MRN: [id number]  DOS:  06/16/2018 Type of visit - description: Acute visit 10 days ago he was putting a bed together and shortly after started to feel "tightness" at the low back, bilateral, some radiation to both buttocks and to the sides of the hips. A friend provided a TENS unit and pt took some leftover Robaxin which did not help much. The pain decreases if the patient lay flat in bed for if he stands up.  It is worse when he sits down and gets up.   Review of Systems No fever chills No bladder or bowel incontinence No rash No saddle anesthesia.  Past Medical History:  Diagnosis Date  . Increased prostate specific antigen (PSA) velocity    prostate BX 3-11 (atypical cells) and 08-2009 negative  . Multiple lipomas     Past Surgical History:  Procedure Laterality Date  . CLOSED MANIPULATION SHOULDER Right 09/24/10  . COLONOSCOPY  09/22/2012   polyp BUT not pre-cancerous recall 2024  . [place]   located at the thimus, had a sternotomy  . KNEE SURGERY  1979   tumor not w/i the joint  . NASAL SEPTUM SURGERY  1980  . PROSTATE BIOPSY     x 2 ---> 2011    Social History   Socioeconomic History  . Marital status: Married    Spouse name: Not on file  . Number of children: 2  . Years of education: Not on file  . Highest education level: Not on file  Occupational History  . Occupation: Medical laboratory scientific officer: [employer]  . Financial resource strain: Not on file  . Food insecurity:    Worry: Not on file    Inability: Not on file  . Transportation needs:    Medical: Not on file    Non-medical: Not on file  Tobacco Use  . Smoking status: Never Smoker  . Smokeless tobacco: Never Used  Substance and Sexual Activity  . Alcohol use: Yes    Alcohol/week: 5.0 standard drinks    Types: 5 Glasses of wine per week  . Drug use: No  . Sexual activity: Not on file    Lifestyle  . Physical activity:    Days per week: Not on file    Minutes per session: Not on file  . Stress: Not on file  Relationships  . Social connections:    Talks on phone: Not on file    Gets together: Not on file    Attends religious service: Not on file    Active member of club or organization: Not on file    Attends meetings of clubs or organizations: Not on file    Relationship status: Not on file  . Intimate partner violence:    Fear of current or ex partner: Not on file    Emotionally abused: Not on file    Physically abused: Not on file    Forced sexual activity: Not on file  Other Topics Concern  . Not on file  Social History Narrative   Unknown Family Hx-Adopted.   Occupation: Forensic psychologist   Married, 2 children   Diet--try to eat healthy   Regular Exercise---3x wk treadmill, walk for 40 minutes   Travel[place] to [place] doing charitable work often   EtOH 5/week, no smoking/substances  Allergies as of 06/16/2018   No Known Allergies     Medication List       Accurate as of June 16, 2018  9:47 AM. Always use your most recent med list.        sildenafil 20 MG tablet Commonly known as:  REVATIO TAKE 3 TO 4 TABLETS (60-80MG ) BY MOUTH DAILY AS NEEDED.           Objective:   Physical Exam BP 134/70 (BP Location: Left Arm, Patient Position: Standing, Cuff Size: Small)   Pulse 80   Temp 98.2 F (36.8 C) (Oral)   Resp 16   Ht 6' 1.75" (1.873 m)   Wt 197 lb 4 oz (89.5 kg)   SpO2 96%   BMI 25.50 kg/m  General:   Well developed, antalgic position, he prefers to stand and avoid sitting. HEENT:  Normocephalic . Face symmetric, atraumatic MSK: No TTP at the lumbosacral spine or SI joints. Skin: Not pale. Not jaundice Neurologic:  alert & oriented X3.  Speech normal, gait appropriate for age and unassisted Motor symmetric but limited by pain.  DTR symmetric except for slightly weak left ankle jerk. Straight leg test performed  while the patient was sitting: Negative Psych--  Cognition and judgment appear intact.  Cooperative with normal attention span and concentration.  Behavior appropriate. No anxious or depressed appearing.      Assessment     Assessment Increased PSA, BX 06-2009: atypical cells. BX again 08-2009 (-) Abnormal CT chest: --Nodules  : Stable per CT 2013, no further CTs if risk is low  --Elevated diaphragma, chronic --Mild ascending aortic aneurysm: CT 04/10/2015 stable. CT chest f/u 2021 Lipomas, multiple, had 2 biopsies when he was a teenager, negative ED   PLAN: Lumbalgia: Acute, following putting together a piece of furniture. Suspect a muscular issue, doubt radiculopathy although he has a slightly decreased ankle jerk. Plan: Prednisone, change from Robaxin to Flexeril, Toradol IM x1, Toradol p.o. GI precautions discussed Heat or cold Call if not gradually improving. Also, is due for a physical exam and a CT chest.  Encouraged to return to the office for CPX.

## 2018-06-16 NOTE — Patient Instructions (Addendum)
Please schedule a physical exam at your earliest convenience. We need to proceed with a CT chest after your physical exam   Rest  Try heat or cold  Prednisone as prescribed  Flexeril, a muscle relaxant twice a day.  Will cause drowsiness.  Toradol is a pain medication similar to Motrin or ibuprofen.  Do not mix with other anti-inflammatories.  Okay to take Tylenol. Always take it with food because may cause gastritis and ulcers.  If you notice nausea, stomach pain, change in the color of stools --->  Stop the medicine and let us know  Gradually stretching once you feel better  Call if not improving in the next few days

## 2018-06-16 NOTE — Progress Notes (Signed)
Pre visit review using our clinic review tool, if applicable. No additional management support is needed unless otherwise documented below in the visit note. 

## 2018-10-08 ENCOUNTER — Encounter: Payer: Self-pay | Admitting: Internal Medicine

## 2018-11-06 ENCOUNTER — Encounter: Payer: Self-pay | Admitting: Internal Medicine

## 2018-11-10 ENCOUNTER — Other Ambulatory Visit: Payer: Self-pay

## 2018-11-10 ENCOUNTER — Encounter: Payer: Self-pay | Admitting: Internal Medicine

## 2018-11-10 ENCOUNTER — Ambulatory Visit (INDEPENDENT_AMBULATORY_CARE_PROVIDER_SITE_OTHER): Payer: Self-pay | Admitting: Internal Medicine

## 2018-11-10 VITALS — BP 124/81 | HR 72 | Temp 98.1°F | Resp 16 | Ht 74.0 in | Wt 198.0 lb

## 2018-11-10 DIAGNOSIS — I7789 Other specified disorders of arteries and arterioles: Secondary | ICD-10-CM

## 2018-11-10 DIAGNOSIS — Z114 Encounter for screening for human immunodeficiency virus [HIV]: Secondary | ICD-10-CM

## 2018-11-10 DIAGNOSIS — Z1159 Encounter for screening for other viral diseases: Secondary | ICD-10-CM | POA: Diagnosis not present

## 2018-11-10 DIAGNOSIS — Z125 Encounter for screening for malignant neoplasm of prostate: Secondary | ICD-10-CM | POA: Diagnosis not present

## 2018-11-10 DIAGNOSIS — R972 Elevated prostate specific antigen [PSA]: Secondary | ICD-10-CM

## 2018-11-10 DIAGNOSIS — Z0000 Encounter for general adult medical examination without abnormal findings: Secondary | ICD-10-CM | POA: Diagnosis not present

## 2018-11-10 NOTE — Progress Notes (Signed)
Subjective:    Patient ID: Nicholas Carrillo, male    DOB: 1961/03/16, 58 y.o.   MRN: [id number]  DOS:  11/10/2018 Type of visit - description: CPX Was seen with lumbalgia the last time, symptoms responded very well, currently asymptomatic  Review of Systems   A 14 point review of systems is negative    Past Medical History:  Diagnosis Date  . Increased prostate specific antigen (PSA) velocity    prostate BX 3-11 (atypical cells) and 08-2009 negative  . Multiple lipomas     Past Surgical History:  Procedure Laterality Date  . CLOSED MANIPULATION SHOULDER Right 09/24/10  . COLONOSCOPY  09/22/2012   polyp BUT not pre-cancerous recall 2024  . [place]   located at the thimus, had a sternotomy  . KNEE SURGERY  1979   tumor not w/i the joint  . NASAL SEPTUM SURGERY  1980  . PROSTATE BIOPSY     x 2 ---> 2011    Social History   Socioeconomic History  . Marital status: Married    Spouse name: Not on file  . Number of children: 2  . Years of education: Not on file  . Highest education level: Not on file  Occupational History  . Occupation: Medical laboratory scientific officer: [employer]  . Financial resource strain: Not on file  . Food insecurity    Worry: Not on file    Inability: Not on file  . Transportation needs    Medical: Not on file    Non-medical: Not on file  Tobacco Use  . Smoking status: Never Smoker  . Smokeless tobacco: Never Used  Substance and Sexual Activity  . Alcohol use: Yes    Alcohol/week: 5.0 standard drinks    Types: 5 Glasses of wine per week  . Drug use: No  . Sexual activity: Not on file  Lifestyle  . Physical activity    Days per week: Not on file    Minutes per session: Not on file  . Stress: Not on file  Relationships  . Social Herbalist on phone: Not on file    Gets together: Not on file    Attends religious service: Not on file    Active member of club or organization: Not on file    Attends  meetings of clubs or organizations: Not on file    Relationship status: Not on file  . Intimate partner violence    Fear of current or ex partner: Not on file    Emotionally abused: Not on file    Physically abused: Not on file    Forced sexual activity: Not on file  Other Topics Concern  . Not on file  Social History Narrative   Unknown Family Hx-Adopted.   Occupation: [employer]   Married   Daughter 2001   Son 2005   Diet--try to eat healthy      [place] to [place] doing charitable work often   EtOH 5/week, no smoking/substances        Family History  Adopted: Yes     Allergies as of 11/10/2018   No Known Allergies     Medication List       Accurate as of November 10, 2018 11:59 PM. If you have any questions, ask your nurse or doctor.        STOP taking these medications   cyclobenzaprine 10 MG tablet Commonly known as:  FLEXERIL Stopped by: Kathlene November, MD   ketorolac 10 MG tablet Commonly known as: TORADOL Stopped by: Kathlene November, MD   predniSONE 10 MG tablet Commonly known as: DELTASONE Stopped by: Kathlene November, MD     TAKE these medications   sildenafil 20 MG tablet Commonly known as: REVATIO TAKE 3 TO 4 TABLETS (60-80MG ) BY MOUTH DAILY AS NEEDED.           Objective:   Physical Exam BP 124/81 (BP Location: Left Arm, Patient Position: Sitting, Cuff Size: Small)   Pulse 72   Temp 98.1 F (36.7 C) (Oral)   Resp 16   Ht 6\' 2"  (1.88 m)   Wt 198 lb (89.8 kg)   SpO2 99%   BMI 25.42 kg/m  General: Well developed, NAD, BMI noted Neck: No  thyromegaly  HEENT:  Normocephalic . Face symmetric, atraumatic Lungs:  CTA B Normal respiratory effort, no intercostal retractions, no accessory muscle use. Heart: RRR,  no murmur.  No pretibial edema bilaterally  Abdomen:  Not distended, soft, non-tender. No rebound or rigidity.   Skin: Exposed areas without rash. Not pale. Not jaundice Neurologic:  alert & oriented X3.  Speech normal, gait  appropriate for age and unassisted Strength symmetric and appropriate for age.  Psych: Cognition and judgment appear intact.  Cooperative with normal attention span and concentration.  Behavior appropriate. No anxious or depressed appearing.     Assessment     Assessment Increased PSA, BX 06-2009: atypical cells. BX again 08-2009 (-) Abnormal CT chest: --Nodules  : Stable per CT 2013, no further CTs if risk is low  --Elevated diaphragma, chronic --Mild ascending aortic aneurysm: CT 04/10/2015 stable. CT chest f/u 2021 Lipomas, multiple, had 2 biopsies when he was a teenager, negative ED   PLAN: Lumbalgia: See last visit, resolved. Increased PSA: Checking labs today ED: Rarely uses sildenafil EKG: nsr RTC 1 year.

## 2018-11-10 NOTE — Progress Notes (Signed)
Pre visit review using our clinic review tool, if applicable. No additional management support is needed unless otherwise documented below in the visit note. 

## 2018-11-10 NOTE — Assessment & Plan Note (Signed)
-  Td 2011; hay yellow fever shots; flu shot when available  - CCS: colonoscopy 2014, next 10 years per GI letter -Prostate cancer screening: DRE last year show slightly enlarged prostate, PSA was normal.  Recheck a PSA. -Labs: CMP, FLP, CBC, A1c, PSA, HIV, hep C -Diet and exercise: Doing well, actually better than before, still active, treadmill, doing some lifting.

## 2018-11-10 NOTE — Patient Instructions (Signed)
GO TO THE LAB : Get the blood work     GO TO THE FRONT DESK Schedule your next appointment   for a physical exam next year

## 2018-11-11 LAB — HEMOGLOBIN A1C: Hgb A1c MFr Bld: 5.1 % (ref 4.6–6.5)

## 2018-11-11 LAB — COMPREHENSIVE METABOLIC PANEL
ALT: 21 U/L (ref 0–53)
AST: 18 U/L (ref 0–37)
Albumin: 4.5 g/dL (ref 3.5–5.2)
Alkaline Phosphatase: 37 U/L — ABNORMAL LOW (ref 39–117)
BUN: 25 mg/dL — ABNORMAL HIGH (ref 6–23)
CO2: 31 mEq/L (ref 19–32)
Calcium: 9.6 mg/dL (ref 8.4–10.5)
Chloride: 100 mEq/L (ref 96–112)
Creatinine, Ser: 1.08 mg/dL (ref 0.40–1.50)
GFR: 70.32 mL/min (ref >60.00)
Glucose, Bld: 88 mg/dL (ref 70–99)
Potassium: 4.7 mEq/L (ref 3.5–5.1)
Sodium: 139 mEq/L (ref 135–145)
Total Bilirubin: 0.9 mg/dL (ref 0.2–1.2)
Total Protein: 6.8 g/dL (ref 6.0–8.3)

## 2018-11-11 LAB — PSA: PSA: 12.64 ng/mL — ABNORMAL HIGH (ref 0.10–4.00)

## 2018-11-11 LAB — CBC WITH DIFFERENTIAL/PLATELET
Basophils Absolute: 0 10*3/uL (ref 0.0–0.1)
Basophils Relative: 0.6 % (ref 0.0–3.0)
Eosinophils Absolute: 0.1 10*3/uL (ref 0.0–0.7)
Eosinophils Relative: 1.1 % (ref 0.0–5.0)
HCT: 45.5 % (ref 39.0–52.0)
Hemoglobin: 15.3 g/dL (ref 13.0–17.0)
Lymphocytes Relative: 29.2 % (ref 12.0–46.0)
Lymphs Abs: 1.6 10*3/uL (ref 0.7–4.0)
MCHC: 33.6 g/dL (ref 30.0–36.0)
MCV: 93.4 fl (ref 78.0–100.0)
Monocytes Absolute: 0.4 10*3/uL (ref 0.1–1.0)
Monocytes Relative: 7.7 % (ref 3.0–12.0)
Neutro Abs: 3.4 10*3/uL (ref 1.4–7.7)
Neutrophils Relative %: 61.4 % (ref 43.0–77.0)
Platelets: 283 10*3/uL (ref 150.0–400.0)
RBC: 4.87 Mil/uL (ref 4.22–5.81)
RDW: 13.7 % (ref 11.5–15.5)
WBC: 5.5 10*3/uL (ref 4.0–10.5)

## 2018-11-11 LAB — LIPID PANEL
Cholesterol: 168 mg/dL (ref 0–200)
HDL: 30.6 mg/dL — ABNORMAL LOW (ref >39.00)
LDL Cholesterol: 122 mg/dL — ABNORMAL HIGH (ref 0–99)
NonHDL: 136.92
Total CHOL/HDL Ratio: 5
Triglycerides: 77 mg/dL (ref 0.0–149.0)
VLDL: 15.4 mg/dL (ref 0.0–40.0)

## 2018-11-11 LAB — HEPATITIS C ANTIBODY
Hepatitis C Ab: NONREACTIVE
SIGNAL TO CUT-OFF: 0.02 (ref <1.00)

## 2018-11-11 LAB — HIV ANTIBODY (ROUTINE TESTING W REFLEX): HIV 1&2 Ab, 4th Generation: NONREACTIVE

## 2018-11-11 NOTE — Assessment & Plan Note (Signed)
Lumbalgia: See last visit, resolved. Increased PSA: Checking labs today ED: Rarely uses sildenafil EKG: nsr RTC 1 year.

## 2018-11-12 NOTE — Addendum Note (Signed)
Addended byDamita Dunnings D on: 11/12/2018 12:56 PM   Modules accepted: Orders

## 2019-02-20 ENCOUNTER — Encounter: Payer: Self-pay | Admitting: Internal Medicine

## 2019-04-28 DIAGNOSIS — R972 Elevated prostate specific antigen [PSA]: Secondary | ICD-10-CM | POA: Diagnosis not present

## 2019-04-28 DIAGNOSIS — R3912 Poor urinary stream: Secondary | ICD-10-CM | POA: Diagnosis not present

## 2019-04-28 DIAGNOSIS — N401 Enlarged prostate with lower urinary tract symptoms: Secondary | ICD-10-CM | POA: Diagnosis not present

## 2019-05-01 DIAGNOSIS — Z20828 Contact with and (suspected) exposure to other viral communicable diseases: Secondary | ICD-10-CM | POA: Diagnosis not present

## 2019-05-31 ENCOUNTER — Other Ambulatory Visit: Payer: Self-pay | Admitting: Internal Medicine

## 2019-06-01 DIAGNOSIS — Z20828 Contact with and (suspected) exposure to other viral communicable diseases: Secondary | ICD-10-CM | POA: Diagnosis not present

## 2019-06-15 DIAGNOSIS — Z20828 Contact with and (suspected) exposure to other viral communicable diseases: Secondary | ICD-10-CM | POA: Diagnosis not present

## 2019-07-03 DIAGNOSIS — Z20828 Contact with and (suspected) exposure to other viral communicable diseases: Secondary | ICD-10-CM | POA: Diagnosis not present

## 2019-07-23 ENCOUNTER — Telehealth: Payer: BC Managed Care – PPO | Admitting: Family

## 2019-07-23 DIAGNOSIS — A09 Infectious gastroenteritis and colitis, unspecified: Secondary | ICD-10-CM

## 2019-07-23 MED ORDER — CIPROFLOXACIN HCL 500 MG PO TABS: 500.0000 mg | ORAL_TABLET | Freq: Two times a day (BID) | ORAL | 0 refills | Status: DC

## 2019-07-23 NOTE — Progress Notes (Signed)
We are sorry that you are not feeling well.  Here is how we plan to help!  Based on what you have shared with me it looks like you have Acute Infectious Diarrhea.  Most cases of acute diarrhea are due to infections with virus and bacteria and are self-limited conditions lasting less than 14 days.  For your symptoms you may take Imodium 2 mg tablets that are over the counter at your local pharmacy. Take two tablet now and then one after each loose stool up to 6 a day.  Antibiotics are not needed for most people with diarrhea.    Optional: I have prescribed Cipro 500 mg twice a day for seven days  HOME CARE  We recommend changing your diet to help with your symptoms for the next few days.  Drink plenty of fluids that contain water salt and sugar. Sports drinks such as Gatorade may help.   You may try broths, soups, bananas, applesauce, soft breads, mashed potatoes or crackers.   You are considered infectious for as long as the diarrhea continues. Hand washing or use of alcohol based hand sanitizers is recommend.  It is best to stay out of work or school until your symptoms stop.   GET HELP RIGHT AWAY  If you have dark yellow colored urine or do not pass urine frequently you should drink more fluids.    If your symptoms worsen   If you feel like you are going to pass out (faint)  You have a new problem  MAKE SURE YOU   Understand these instructions.  Will watch your condition.  Will get help right away if you are not doing well or get worse.  Your e-visit answers were reviewed by a board certified advanced clinical practitioner to complete your personal care plan.  Depending on the condition, your plan could have included both over the counter or prescription medications.  If there is a problem please reply  once you have received a response from your provider.  Your safety is important to [place].  If you have drug allergies check your prescription carefully.    You can use  MyChart to ask questions about today's visit, request a non-urgent call back, or ask for a work or school excuse for 24 hours related to this e-Visit. If it has been greater than 24 hours you will need to follow up with your provider, or enter a new e-Visit to address those concerns.   You will get an e-mail in the next two days asking about your experience.  I hope that your e-visit has been valuable and will speed your recovery. Thank you for using e-visits.  Greater than 5 minutes, yet less than 10 minutes of time have been spent researching, coordinating, and implementing care for this patient today.  Thank you for the details you included in the comment boxes. Those details are very helpful in determining the best course of treatment for you and help [place] to provide the best care.

## 2019-11-11 ENCOUNTER — Encounter: Payer: Self-pay | Admitting: Internal Medicine

## 2019-11-11 ENCOUNTER — Other Ambulatory Visit: Payer: Self-pay

## 2019-11-11 ENCOUNTER — Ambulatory Visit (INDEPENDENT_AMBULATORY_CARE_PROVIDER_SITE_OTHER): Payer: BC Managed Care – PPO | Admitting: Internal Medicine

## 2019-11-11 VITALS — BP 126/78 | HR 71 | Temp 98.1°F | Resp 18 | Ht 74.0 in | Wt 201.0 lb

## 2019-11-11 DIAGNOSIS — I712 Thoracic aortic aneurysm, without rupture: Secondary | ICD-10-CM

## 2019-11-11 DIAGNOSIS — Z23 Encounter for immunization: Secondary | ICD-10-CM | POA: Diagnosis not present

## 2019-11-11 DIAGNOSIS — Z0000 Encounter for general adult medical examination without abnormal findings: Secondary | ICD-10-CM

## 2019-11-11 DIAGNOSIS — I7789 Other specified disorders of arteries and arterioles: Secondary | ICD-10-CM

## 2019-11-11 DIAGNOSIS — I7121 Aneurysm of the ascending aorta, without rupture: Secondary | ICD-10-CM

## 2019-11-11 DIAGNOSIS — R972 Elevated prostate specific antigen [PSA]: Secondary | ICD-10-CM

## 2019-11-11 LAB — CBC WITH DIFFERENTIAL/PLATELET
Basophils Absolute: 0 10*3/uL (ref 0.0–0.1)
Basophils Relative: 0.9 % (ref 0.0–3.0)
Eosinophils Absolute: 0.1 10*3/uL (ref 0.0–0.7)
Eosinophils Relative: 2.1 % (ref 0.0–5.0)
HCT: 43.9 % (ref 39.0–52.0)
Hemoglobin: 15 g/dL (ref 13.0–17.0)
Lymphocytes Relative: 25.8 % (ref 12.0–46.0)
Lymphs Abs: 1.3 10*3/uL (ref 0.7–4.0)
MCHC: 34.3 g/dL (ref 30.0–36.0)
MCV: 93.2 fl (ref 78.0–100.0)
Monocytes Absolute: 0.4 10*3/uL (ref 0.1–1.0)
Monocytes Relative: 8.2 % (ref 3.0–12.0)
Neutro Abs: 3.1 10*3/uL (ref 1.4–7.7)
Neutrophils Relative %: 63 % (ref 43.0–77.0)
Platelets: 277 10*3/uL (ref 150.0–400.0)
RBC: 4.71 Mil/uL (ref 4.22–5.81)
RDW: 14.1 % (ref 11.5–15.5)
WBC: 4.9 10*3/uL (ref 4.0–10.5)

## 2019-11-11 LAB — COMPREHENSIVE METABOLIC PANEL
ALT: 25 U/L (ref 0–53)
AST: 17 U/L (ref 0–37)
Albumin: 4.3 g/dL (ref 3.5–5.2)
Alkaline Phosphatase: 37 U/L — ABNORMAL LOW (ref 39–117)
BUN: 13 mg/dL (ref 6–23)
CO2: 32 mEq/L (ref 19–32)
Calcium: 9.6 mg/dL (ref 8.4–10.5)
Chloride: 103 mEq/L (ref 96–112)
Creatinine, Ser: 1.06 mg/dL (ref 0.40–1.50)
GFR: 71.61 mL/min (ref >60.00)
Glucose, Bld: 103 mg/dL — ABNORMAL HIGH (ref 70–99)
Potassium: 4.6 mEq/L (ref 3.5–5.1)
Sodium: 140 mEq/L (ref 135–145)
Total Bilirubin: 0.6 mg/dL (ref 0.2–1.2)
Total Protein: 6.7 g/dL (ref 6.0–8.3)

## 2019-11-11 LAB — LIPID PANEL
Cholesterol: 180 mg/dL (ref 0–200)
HDL: 39 mg/dL — ABNORMAL LOW (ref >39.00)
LDL Cholesterol: 127 mg/dL — ABNORMAL HIGH (ref 0–99)
NonHDL: 141.34
Total CHOL/HDL Ratio: 5
Triglycerides: 72 mg/dL (ref 0.0–149.0)
VLDL: 14.4 mg/dL (ref 0.0–40.0)

## 2019-11-11 LAB — TSH: TSH: 1.99 u[IU]/mL (ref 0.35–4.50)

## 2019-11-11 NOTE — Patient Instructions (Signed)
Please see the urologist as recommended  Get a flu shot this fall  Will schedule a CAT scan  GO TO THE LAB : Get the blood work     [place], [place] back for a physical exam in 1 year

## 2019-11-11 NOTE — Progress Notes (Signed)
   Subjective:    Patient ID: Nicholas Carrillo, male    DOB: August 07, 1960, 59 y.o.   MRN: [id number]  DOS:  11/11/2019 Type of visit - description: cpx No concerns, feeling well.   Review of Systems  A 14 point review of systems is negative    Past Medical History:  Diagnosis Date  . Increased prostate specific antigen (PSA) velocity    prostate BX 3-11 (atypical cells) and 08-2009 negative  . Multiple lipomas     Past Surgical History:  Procedure Laterality Date  . CLOSED MANIPULATION SHOULDER Right 09/24/10  . COLONOSCOPY  09/22/2012   polyp BUT not pre-cancerous recall 2024  . [place]   located at the thimus, had a sternotomy  . KNEE SURGERY  1979   tumor not w/i the joint  . NASAL SEPTUM SURGERY  1980  . PROSTATE BIOPSY     x 2 ---> 2011    Allergies as of 11/11/2019   No Known Allergies     Medication List       Accurate as of November 11, 2019 11:59 PM. If you have any questions, ask your nurse or doctor.        ciprofloxacin 500 MG tablet Commonly known as: Cipro Take 1 tablet (500 mg total) by mouth 2 (two) times daily.   sildenafil 20 MG tablet Commonly known as: REVATIO TAKE 3 TO 4 TABLETS (60-80MG ) BY MOUTH DAILY AS NEEDED.          Objective:   Physical Exam BP 126/78   Pulse 71   Temp 98.1 F (36.7 C) (Oral)   Resp 18   Ht 6\' 2"  (1.88 m)   Wt 201 lb (91.2 kg)   SpO2 97%   BMI 25.81 kg/m  General: Well developed, NAD, BMI noted Neck: No  thyromegaly  HEENT:  Normocephalic . Face symmetric, atraumatic Lungs:  CTA B Normal respiratory effort, no intercostal retractions, no accessory muscle use. Heart: RRR,  no murmur.  Abdomen:  Not distended, soft, non-tender. No rebound or rigidity.   Lower extremities: no pretibial edema bilaterally  Skin: Exposed areas without rash. Not pale. Not jaundice Neurologic:  alert & oriented X3.  Speech normal, gait appropriate for age and unassisted Strength symmetric and appropriate  for age.  Psych: Cognition and judgment appear intact.  Cooperative with normal attention span and concentration.  Behavior appropriate. No anxious or depressed appearing.     Assessment      Assessment Increased PSA, BX 06-2009: atypical cells. BX again 08-2009 (-) Abnormal CT chest: --Nodules  : Stable per CT 2013, no further CTs if risk is low  --Elevated diaphragma, chronic --Mild ascending aortic aneurysm: CT 04/10/2015 stable. CT chest f/u 2021 Lipomas, multiple, had 2 biopsies when he was a teenager, negative ED   PLAN: Increase PSA: See comments under physical exam Ascending aortic aneurysm: Repeat CT chest angiogram BP upon arrival 138/98: Recheck=  126/78 RTC 1 year     This visit occurred during the SARS-CoV-2 public health emergency.  Safety protocols were in place, including screening questions prior to the visit, additional usage of staff PPE, and extensive cleaning of exam room while observing appropriate contact time as indicated for disinfecting solutions. A

## 2019-11-11 NOTE — Progress Notes (Signed)
Pre visit review using our clinic review tool, if applicable. No additional management support is needed unless otherwise documented below in the visit note. 

## 2019-11-13 ENCOUNTER — Encounter: Payer: Self-pay | Admitting: Internal Medicine

## 2019-11-13 NOTE — Assessment & Plan Note (Signed)
-  Td 2011, booster today -Had Covid vaccinations - CCS: colonoscopy 2014, next 10 years per GI letter -Prostate cancer screening:  Last PSA quite elevated, saw urology 04-2019, DRE negative, PSA was rechecked, patient reported was lower, plans to see urology again in the next few weeks. -Labs: CMP, FLP, CBC, TSH -Diet and exercise: Counseled

## 2019-11-13 NOTE — Assessment & Plan Note (Signed)
Increase PSA: See comments under physical exam Ascending aortic aneurysm: Repeat CT chest angiogram BP upon arrival 138/98: Recheck=  126/78 RTC 1 year

## 2019-11-14 ENCOUNTER — Other Ambulatory Visit: Payer: BC Managed Care – PPO

## 2019-11-14 DIAGNOSIS — R972 Elevated prostate specific antigen [PSA]: Secondary | ICD-10-CM | POA: Diagnosis not present

## 2019-11-14 LAB — PSA: PSA: 2 ng/mL (ref <=4.0)

## 2019-11-14 NOTE — Telephone Encounter (Signed)
Received call from [facility] stating they were unable to add PSA to inhouse testing on Friday and requested future order be placed for [facility] and specimen would be sent out. Attempted to place order and see that Noralee Space has already placed order.

## 2019-11-27 DIAGNOSIS — Z20828 Contact with and (suspected) exposure to other viral communicable diseases: Secondary | ICD-10-CM | POA: Diagnosis not present

## 2019-12-12 ENCOUNTER — Ambulatory Visit (HOSPITAL_BASED_OUTPATIENT_CLINIC_OR_DEPARTMENT_OTHER)
Admission: RE | Admit: 2019-12-12 | Discharge: 2019-12-12 | Disposition: A | Discharge status: Home / Self Care | Payer: BC Managed Care – PPO | Source: Ambulatory Visit | Attending: Internal Medicine | Admitting: Internal Medicine

## 2019-12-12 ENCOUNTER — Other Ambulatory Visit: Payer: Self-pay

## 2019-12-12 ENCOUNTER — Encounter (HOSPITAL_BASED_OUTPATIENT_CLINIC_OR_DEPARTMENT_OTHER): Payer: Self-pay

## 2019-12-12 DIAGNOSIS — J984 Other disorders of lung: Secondary | ICD-10-CM | POA: Diagnosis not present

## 2019-12-12 DIAGNOSIS — I712 Thoracic aortic aneurysm, without rupture: Secondary | ICD-10-CM | POA: Diagnosis not present

## 2019-12-12 DIAGNOSIS — I7789 Other specified disorders of arteries and arterioles: Secondary | ICD-10-CM | POA: Insufficient documentation

## 2019-12-12 DIAGNOSIS — Q791 Other congenital malformations of diaphragm: Secondary | ICD-10-CM | POA: Diagnosis not present

## 2019-12-12 DIAGNOSIS — I700 Atherosclerosis of aorta: Secondary | ICD-10-CM | POA: Diagnosis not present

## 2019-12-12 MED ORDER — IOHEXOL 350 MG/ML SOLN
100.0000 mL | Freq: Once | INTRAVENOUS | Status: AC | PRN
  Administered 2019-12-12: 100 mL via INTRAVENOUS

## 2019-12-13 ENCOUNTER — Encounter: Payer: Self-pay | Admitting: Internal Medicine

## 2019-12-13 NOTE — Addendum Note (Signed)
Addended byDamita Dunnings D on: 12/13/2019 09:29 AM   Modules accepted: Orders

## 2020-01-04 ENCOUNTER — Encounter: Payer: BC Managed Care – PPO | Admitting: Surgery

## 2020-01-08 DIAGNOSIS — Z20828 Contact with and (suspected) exposure to other viral communicable diseases: Secondary | ICD-10-CM | POA: Diagnosis not present

## 2020-01-18 ENCOUNTER — Encounter: Payer: BC Managed Care – PPO | Admitting: Surgery

## 2020-02-01 ENCOUNTER — Encounter: Payer: BC Managed Care – PPO | Admitting: Surgery

## 2020-02-02 ENCOUNTER — Other Ambulatory Visit: Payer: Self-pay | Admitting: *Deleted

## 2020-02-02 ENCOUNTER — Encounter: Payer: Self-pay | Admitting: Surgery

## 2020-02-02 ENCOUNTER — Other Ambulatory Visit: Payer: Self-pay

## 2020-02-02 ENCOUNTER — Institutional Professional Consult (permissible substitution) (INDEPENDENT_AMBULATORY_CARE_PROVIDER_SITE_OTHER): Payer: BC Managed Care – PPO | Admitting: Surgery

## 2020-02-02 VITALS — BP 145/90 | HR 70 | Temp 97.9°F | Resp 20 | Ht 74.0 in | Wt 196.0 lb

## 2020-02-02 DIAGNOSIS — I7121 Aneurysm of the ascending aorta, without rupture: Secondary | ICD-10-CM

## 2020-02-02 DIAGNOSIS — I712 Thoracic aortic aneurysm, without rupture: Secondary | ICD-10-CM | POA: Diagnosis not present

## 2020-02-02 NOTE — Progress Notes (Signed)
Cardiothoracic Surgery Consultation  PCP is Colon Branch, MD Referring Provider is Colon Branch, MD  Chief Complaint  Patient presents with   Thoracic Aortic Aneurysm    Surgical consult, CTA Chest 12/12/19    HPI:  The patient is a 59 year old gentleman who has an ascending aortic aneurysm that was initially found on CT scan of the chest in 01/2011 which was done to evaluate an elevated left hemidiaphragm.  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  His ascending aortic aneurysm was not mentioned in the CT scan report in 2012 but by my measurement was 4.2 to 4.3 cm at that time.  He had a CTA of the chest in 03/2015 and radiology noted the ascending aorta to measure 4.1 cm in the report.  I have personally reviewed reviewed the study and measured the aorta and it was actually 4.5 cm.  He had a repeat CTA of the chest on 8/59/2021 and the ascending aortic aneurysm was measured at 4.8 cm by radiology.  I have also personally reviewed and measured this aneurysm and it is currently 4.6 cm by my measurement.  He was adopted and does not know anything about his family history.   Past Medical History:  Diagnosis Date   Increased prostate specific antigen (PSA) velocity    prostate BX 3-11 (atypical cells) and 08-2009 negative   Multiple lipomas     Past Surgical History:  Procedure Laterality Date   CLOSED MANIPULATION SHOULDER Right 09/24/10   COLONOSCOPY  09/22/2012   polyp BUT not pre-cancerous recall 2024   [place]   located at the thimus, had a sternotomy   [place]   tumor not w/i the joint   NASAL SEPTUM SURGERY  1980   PROSTATE BIOPSY     x 2 ---> 2011    Family History  Adopted: Yes    Social History Social History   Tobacco Use   Smoking status: Never Smoker   Smokeless tobacco: Never Used  Substance Use Topics    Alcohol use: Yes    Alcohol/week: 5.0 standard drinks    Types: 5 Glasses of wine per week   Drug use: No    Current Outpatient Medications  Medication Sig Dispense Refill   sildenafil (REVATIO) 20 MG tablet TAKE 3 TO 4 TABLETS (60-80MG ) BY MOUTH DAILY AS NEEDED. (Patient not taking: Reported on 11/11/2019) 30 tablet 5   No current facility-administered medications for this visit.    No Known Allergies  Review of Systems  Constitutional: Negative.   HENT: Negative.   Eyes: Negative.   Respiratory: Negative.   Cardiovascular: Negative for chest pain.  Gastrointestinal: Negative.   Endocrine: Negative.   Genitourinary: Negative.   Musculoskeletal: Negative.   Allergic/Immunologic: Negative.   Neurological: Negative.   Hematological: Negative.   Psychiatric/Behavioral: The patient is nervous/anxious.     BP (!) 145/90    Pulse 70    Temp 97.9 F (36.6 C) (Skin)    Resp 20    Ht 6\' 2"  (1.88 m)    Wt 196 lb (88.9 kg)    SpO2 97% Comment: RA   BMI 25.16 kg/m  Physical Exam Constitutional:      Appearance: Normal appearance. He is normal weight.  Cardiovascular:     Rate and Rhythm: Normal rate and regular rhythm.  Heart sounds: Normal heart sounds. No murmur heard.      Comments: Old median sternotomy scar from thymic tumor resection Pulmonary:     Effort: Pulmonary effort is normal.     Breath sounds: Normal breath sounds.  Neurological:     Mental Status: He is alert.      Diagnostic Tests:  CLINICAL DATA:  Enlarged aorta, nontraumatic  EXAM: CT ANGIOGRAPHY CHEST WITH CONTRAST  TECHNIQUE: Multidetector CT imaging of the chest was performed using the standard protocol during bolus administration of intravenous contrast. Multiplanar CT image reconstructions and MIPs were obtained to evaluate the vascular anatomy.  CONTRAST:  129mL OMNIPAQUE IOHEXOL 350 MG/ML SOLN IV  COMPARISON:  04/19/2015  FINDINGS: Cardiovascular: Aneurysmal dilatation  ascending thoracic aorta 4.8 cm diameter, previously 4.6 cm at same level. Aortic arch and descending thoracic aorta normal caliber. Heart unremarkable. No pericardial effusion. Pulmonary arteries grossly patent on non targeted exam.  Mediastinum/Nodes: Base of cervical region normal appearance. Esophagus unremarkable. No thoracic adenopathy.  Lungs/Pleura: Small nodule versus nodular area of scarring 6 mm diameter LEFT upper lobe image 13. RIGHT middle lobe nodular focus 5 mm diameter image 43 unchanged. 6 mm nodular focus RIGHT major fissure versus intra fissural lymph node image 37 unchanged. 2 mm subpleural nodule LEFT upper lobe image 17. 3 mm nodular pleural thickening versus lymph node LEFT major fissure image 26. No infiltrate, pleural effusion or pneumothorax. Minimal scarring at anterior lingula unchanged.  Upper Abdomen: Multiple hepatic cysts, largest 4.1 cm greatest diameter. Eventration of LEFT diaphragm with high position of spleen, stomach, and splenic flexure. Remaining visualized upper abdomen unremarkable.  Musculoskeletal: Prior median sternotomy. No acute osseous findings.  Review of the MIP images confirms the above findings.  IMPRESSION: Aneurysmal dilatation of the ascending thoracic aorta 4.8 cm diameter, previously 4.6 cm; Ascending thoracic aortic aneurysm. Recommend semi-annual imaging followup by CTA or MRA and referral to cardiothoracic surgery if not already obtained. This recommendation follows 2010 ACCF/AHA/AATS/ACR/ASA/SCA/SCAI/SIR/STS/SVM Guidelines for the Diagnosis and Management of Patients With Thoracic Aortic Disease. Circulation. 2010; 121: M546-T035. Aortic aneurysm NOS (ICD10-I71.9)  Multiple stable BILATERAL pulmonary nodules with a single 6 mm LEFT upper lobe nodule which is more prominent than on previous exam; follow-up imaging recommended in 6 months to ensure stability.  Multiple hepatic cysts.  Aortic  Atherosclerosis (ICD10-I70.0).   Electronically Signed   By: Lavonia Dana M.D.   On: 12/12/2019 08:38  Impression:  This 59 year old gentleman has a 4.6 cm fusiform ascending aortic aneurysm with a 2.5 cm descending aorta.  There is been minimal change since 03/2015 when it measured 4.5 cm.  I have personally reviewed all of his scans in detail and done the measurements myself which are different than what radiology measured.  There has been minimal change in this aneurysm over the past 5 years.  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  I think would be worthwhile getting a baseline 2D echocardiogram to evaluate the aortic valve to see if it is a trileaflet or bicuspid valve.  I do not hear any murmur to suggest insufficiency or stenosis.  His ascending aortic aneurysm is still well below the usual surgical threshold of 5.5 cm.  I reviewed the images with him and answered his questions.  I stressed the importance of good blood pressure control and preventing further enlargement and acute aortic dissection.  I think his risk of aortic dissection at the current size  is around 0.1 %/year and much less than the risk of surgery.  The CT scan also shows small bilateral lung nodules most of which are unchanged.  There is a 6 mm left upper lobe nodule peripherally which is more prominent than it was on the previous exam 6 years ago.  Radiology recommended doing a follow-up CT of the chest in 6 months to ensure stability and I think that is reasonable.  I doubt that these nodules are anything to worry about.  Plan:  I will see him back in 6 months with a CT scan of the chest without contrast to follow-up on the pulmonary nodules.  I will order a 2D echocardiogram to evaluate his aortic valve and will call him with the results.  If his CT scan in 6 months shows stable pulmonary nodules that I would recommend repeating his CT of the chest 1  year after that.  He is in agreement with that plan.  I spent 20 minutes performing this consultation and > 50% of this time was spent face to face counseling and coordinating the care of this patient's ascending aortic aneurysm and bilateral pulmonary nodules. Gaye Pollack, MD [facility] [phone]

## 2020-02-07 DIAGNOSIS — N401 Enlarged prostate with lower urinary tract symptoms: Secondary | ICD-10-CM | POA: Diagnosis not present

## 2020-02-07 DIAGNOSIS — R972 Elevated prostate specific antigen [PSA]: Secondary | ICD-10-CM | POA: Diagnosis not present

## 2020-02-07 DIAGNOSIS — R3912 Poor urinary stream: Secondary | ICD-10-CM | POA: Diagnosis not present

## 2020-02-23 ENCOUNTER — Other Ambulatory Visit: Payer: Self-pay

## 2020-02-23 ENCOUNTER — Ambulatory Visit (HOSPITAL_COMMUNITY): Payer: BC Managed Care – PPO | Attending: Cardiology

## 2020-02-23 DIAGNOSIS — I7121 Aneurysm of the ascending aorta, without rupture: Secondary | ICD-10-CM

## 2020-02-23 DIAGNOSIS — I712 Thoracic aortic aneurysm, without rupture: Secondary | ICD-10-CM | POA: Insufficient documentation

## 2020-02-23 LAB — ECHOCARDIOGRAM COMPLETE
Area-P 1/2: 3.85 cm2
S' Lateral: 2.6 cm

## 2020-02-23 MED ORDER — PERFLUTREN LIPID MICROSPHERE
1.0000 mL | INTRAVENOUS | Status: AC | PRN
  Administered 2020-02-23: 2 mL via INTRAVENOUS

## 2020-02-27 DIAGNOSIS — Z20822 Contact with and (suspected) exposure to covid-19: Secondary | ICD-10-CM | POA: Diagnosis not present

## 2020-03-19 DIAGNOSIS — Z20822 Contact with and (suspected) exposure to covid-19: Secondary | ICD-10-CM | POA: Diagnosis not present

## 2020-03-23 DIAGNOSIS — Z20822 Contact with and (suspected) exposure to covid-19: Secondary | ICD-10-CM | POA: Diagnosis not present

## 2020-07-12 ENCOUNTER — Other Ambulatory Visit: Payer: Self-pay | Admitting: *Deleted

## 2020-07-12 DIAGNOSIS — R9389 Abnormal findings on diagnostic imaging of other specified body structures: Secondary | ICD-10-CM

## 2020-08-11 ENCOUNTER — Other Ambulatory Visit: Payer: Self-pay | Admitting: Internal Medicine

## 2020-08-15 ENCOUNTER — Other Ambulatory Visit: Payer: BC Managed Care – PPO

## 2020-08-15 ENCOUNTER — Ambulatory Visit: Payer: BC Managed Care – PPO | Admitting: Surgery

## 2020-08-24 ENCOUNTER — Other Ambulatory Visit: Payer: Self-pay

## 2020-08-24 ENCOUNTER — Ambulatory Visit
Admission: RE | Admit: 2020-08-24 | Discharge: 2020-08-24 | Disposition: A | Discharge status: Home / Self Care | Payer: BC Managed Care – PPO | Source: Ambulatory Visit | Attending: Surgery | Admitting: Surgery

## 2020-08-24 DIAGNOSIS — R918 Other nonspecific abnormal finding of lung field: Secondary | ICD-10-CM | POA: Diagnosis not present

## 2020-08-24 DIAGNOSIS — R9389 Abnormal findings on diagnostic imaging of other specified body structures: Secondary | ICD-10-CM

## 2020-08-29 ENCOUNTER — Encounter: Payer: Self-pay | Admitting: Surgery

## 2020-08-29 ENCOUNTER — Ambulatory Visit (INDEPENDENT_AMBULATORY_CARE_PROVIDER_SITE_OTHER): Payer: BC Managed Care – PPO | Admitting: Surgery

## 2020-08-29 ENCOUNTER — Other Ambulatory Visit: Payer: Self-pay

## 2020-08-29 VITALS — BP 132/87 | HR 68 | Resp 20 | Wt 182.0 lb

## 2020-08-29 DIAGNOSIS — I7121 Aneurysm of the ascending aorta, without rupture: Secondary | ICD-10-CM

## 2020-08-29 DIAGNOSIS — R918 Other nonspecific abnormal finding of lung field: Secondary | ICD-10-CM | POA: Diagnosis not present

## 2020-08-29 DIAGNOSIS — I712 Thoracic aortic aneurysm, without rupture: Secondary | ICD-10-CM

## 2020-08-29 NOTE — Progress Notes (Signed)
HPI:  This 60 year old gentleman returns for follow-up of a 4.6 cm fusiform ascending aortic aneurysm that has essentially been stable since 2016 when it was measured at 4.5 cm.  Previous echocardiogram showed a trileaflet aortic valve with trivial aortic insufficiency.  His last CT scan in August 2020 It also showed small bilateral lung nodules as well as a 6 mm left upper lobe nodule peripherally that was more prominent than it was on the prior exam 6 years before.  He has continued to do well without chest pain or shortness of breath.  He has known hypertension which he tries to keep under good control.  Current Outpatient Medications  Medication Sig Dispense Refill  . sildenafil (REVATIO) 20 MG tablet TAKE 3 TO 4 TABLETS (60-80MG ) BY MOUTH DAILY AS NEEDED. 30 tablet 5   No current facility-administered medications for this visit.     Physical Exam: BP 132/87 (BP Location: Right Arm, Patient Position: Sitting)   Pulse 68   Resp 20   Wt 82.6 kg   SpO2 100% Comment: RA  BMI 23.37 kg/m  He looks well. Cardiac exam shows a regular rate and rhythm with normal heart sounds.  There is no murmur. Lungs are clear  Diagnostic Tests:  Narrative & Impression  CLINICAL DATA:  Follow-up pulmonary nodules  EXAM: CT CHEST WITHOUT CONTRAST  TECHNIQUE: Multidetector CT imaging of the chest was performed following the standard protocol without IV contrast.  COMPARISON:  Multiple priors including CT chest December 12, 2019.  FINDINGS: Cardiovascular: Unchanged aneurysmal dilation of the ascending aorta measuring 4.8 cm in maximum axial diameter. The aortic arch and descending thoracic aorta are normal in caliber. Normal size heart.  Mediastinum/Nodes: No suspicious thyroid right nodularity. No pathologically enlarged axillary or mediastinal lymph nodes. The trachea esophagus are grossly unremarkable.  Lungs/Pleura: Decreased size of the small left upper lobe  pulmonary nodule now measuring 3 mm on image 36/5, previously 6 mm. Unchanged size of the right middle lobe nodule measuring 5 mm on image 111/5. Unchanged size of the 6 mm nodular focus along the right major fissure likely intra fissural lymph node on image 95/5. Unchanged 3 mm triangular nodule along the right major fissure on image 93/5 likely a perifissural lymph node as well. Unchanged size of the 2 mm subpleural left upper lobe nodule on image 46/5. Unchanged size of the 3 mm triangular perifissural nodule along left major fissure on image 71/5 likely a perifissural lymph node. No new or enlarging suspicious pulmonary nodules or masses.  Upper Abdomen: Similar eventration of the left hemidiaphragm. Multiple hepatic cysts. No acute abnormality.  Musculoskeletal: Prior median sternotomy. No acute osseous abnormality.  IMPRESSION: 1. Decreased size of the left upper lobe pulmonary nodule, favor benign post infectious/inflammatory. Additional small bilateral pulmonary nodules are unchanged, and also favored benign. No new or enlarging suspicious pulmonary nodules or masses. 2. Unchanged aneurysmal dilation of the ascending aorta measuring up to 4.8 cm in maximum axial diameter.   Electronically Signed   By: Dahlia Bailiff MD   On: 08/24/2020 13:34     Impression:  His ascending aortic aneurysm was measured at 4.8 cm on his current scan and there has not been any significant change compared to his scan in August last year.  This is still well below the surgical threshold of 5.5 cm.  I reviewed the CT images with him and answered his questions.  I stressed the importance of continued good blood pressure control in preventing  further enlargement and acute aortic dissection.  I advised him against doing any heavy weight lifting that may result in a Valsalva maneuver and sudden rise in his blood pressure.  The left upper lobe pulmonary nodule has decreased in size compared to his  previous scan is most likely benign.  The other small bilateral pulmonary nodules are unchanged and suggest benign etiology.  Plan:  He will return to see me in 1 year with a CT scan of the chest to follow-up on his ascending aortic aneurysm.  I spent 15 minutes performing this established patient evaluation and > 50% of this time was spent face to face counseling and coordinating the care of this patient's aortic aneurysm and lung nodules.  Gaye Pollack, MD [facility] [phone]

## 2020-11-13 ENCOUNTER — Encounter: Payer: Self-pay | Admitting: Internal Medicine

## 2020-11-13 ENCOUNTER — Other Ambulatory Visit: Payer: Self-pay

## 2020-11-13 ENCOUNTER — Ambulatory Visit (INDEPENDENT_AMBULATORY_CARE_PROVIDER_SITE_OTHER): Payer: 59 | Admitting: Internal Medicine

## 2020-11-13 VITALS — BP 128/80 | HR 70 | Temp 98.0°F | Resp 16 | Ht 73.0 in | Wt 194.0 lb

## 2020-11-13 DIAGNOSIS — Z0000 Encounter for general adult medical examination without abnormal findings: Secondary | ICD-10-CM | POA: Diagnosis not present

## 2020-11-13 DIAGNOSIS — Z23 Encounter for immunization: Secondary | ICD-10-CM

## 2020-11-13 LAB — CBC WITH DIFFERENTIAL/PLATELET
Basophils Absolute: 0 10*3/uL (ref 0.0–0.1)
Basophils Relative: 0.8 % (ref 0.0–3.0)
Eosinophils Absolute: 0.1 10*3/uL (ref 0.0–0.7)
Eosinophils Relative: 2.3 % (ref 0.0–5.0)
HCT: 43.3 % (ref 39.0–52.0)
Hemoglobin: 14.7 g/dL (ref 13.0–17.0)
Lymphocytes Relative: 25.2 % (ref 12.0–46.0)
Lymphs Abs: 1.1 10*3/uL (ref 0.7–4.0)
MCHC: 34 g/dL (ref 30.0–36.0)
MCV: 94.7 fl (ref 78.0–100.0)
Monocytes Absolute: 0.4 10*3/uL (ref 0.1–1.0)
Monocytes Relative: 8.2 % (ref 3.0–12.0)
Neutro Abs: 2.9 10*3/uL (ref 1.4–7.7)
Neutrophils Relative %: 63.5 % (ref 43.0–77.0)
Platelets: 257 10*3/uL (ref 150.0–400.0)
RBC: 4.57 Mil/uL (ref 4.22–5.81)
RDW: 13.6 % (ref 11.5–15.5)
WBC: 4.5 10*3/uL (ref 4.0–10.5)

## 2020-11-13 LAB — LIPID PANEL
Cholesterol: 153 mg/dL (ref 0–200)
HDL: 33.7 mg/dL — ABNORMAL LOW (ref >39.00)
LDL Cholesterol: 108 mg/dL — ABNORMAL HIGH (ref 0–99)
NonHDL: 119.07
Total CHOL/HDL Ratio: 5
Triglycerides: 54 mg/dL (ref 0.0–149.0)
VLDL: 10.8 mg/dL (ref 0.0–40.0)

## 2020-11-13 LAB — COMPREHENSIVE METABOLIC PANEL
ALT: 19 U/L (ref 0–53)
AST: 16 U/L (ref 0–37)
Albumin: 4.1 g/dL (ref 3.5–5.2)
Alkaline Phosphatase: 33 U/L — ABNORMAL LOW (ref 39–117)
BUN: 12 mg/dL (ref 6–23)
CO2: 32 mEq/L (ref 19–32)
Calcium: 9.1 mg/dL (ref 8.4–10.5)
Chloride: 103 mEq/L (ref 96–112)
Creatinine, Ser: 1.01 mg/dL (ref 0.40–1.50)
GFR: 81.35 mL/min (ref >60.00)
Glucose, Bld: 97 mg/dL (ref 70–99)
Potassium: 4.2 mEq/L (ref 3.5–5.1)
Sodium: 141 mEq/L (ref 135–145)
Total Bilirubin: 0.6 mg/dL (ref 0.2–1.2)
Total Protein: 6.6 g/dL (ref 6.0–8.3)

## 2020-11-13 NOTE — Progress Notes (Signed)
Subjective:    Patient ID: Nicholas Carrillo, male    DOB: 15-Jan-1961, 60 y.o.   MRN: [id number]  DOS:  11/13/2020 Type of visit - description: CPX Patient is doing well. Has no major concerns.  Wt Readings from Last 3 Encounters:  11/13/20 194 lb (88 kg)  08/29/20 182 lb (82.6 kg)  02/02/20 196 lb (88.9 kg)    Review of Systems  A 14 point review of systems is negative    Past Medical History:  Diagnosis Date   Increased prostate specific antigen (PSA) velocity    prostate BX 3-11 (atypical cells) and 08-2009 negative   Multiple lipomas     Past Surgical History:  Procedure Laterality Date   CLOSED MANIPULATION SHOULDER Right 09/24/10   COLONOSCOPY  09/22/2012   polyp BUT not pre-cancerous recall 2024   HEMANGIOMA EXCISION  1972   located at the thimus, had a sternotomy   [place]   tumor not w/i the joint   NASAL SEPTUM SURGERY  1980   PROSTATE BIOPSY     x 2 ---> 2011   Social History   Socioeconomic History   Marital status: Married    Spouse name: Not on file   Number of children: 2   Years of education: Not on file   Highest education level: Not on file  Occupational History   Occupation: Attorney    Employer: [employer]  Tobacco Use   Smoking status: Never   Smokeless tobacco: Never  Substance and Sexual Activity   Alcohol use: Yes    Alcohol/week: 5.0 standard drinks    Types: 5 Glasses of wine per week   Drug use: No   Sexual activity: Not on file  Other Topics Concern   Not on file  Social History Narrative   Unknown Family Hx-Adopted.   Occupation: Forensic psychologist   Married   Daughter 2001 [employer]   Son , Owen 2005   Diet--try to eat healthy      [place] doing charitable work often   EtOH 5/week, no smoking/substances      Social Determinants of Radio broadcast assistant Strain: Not on [employer] Insecurity: Not on file  Transportation Needs: Not on file  Physical Activity: Not on file  Stress: Not  on file  Social Connections: Not on file  Intimate Partner Violence: Not on file    Allergies as of 11/13/2020   No Known Allergies      Medication List        Accurate as of November 13, 2020 11:59 PM. If you have any questions, ask your nurse or doctor.          sildenafil 20 MG tablet Commonly known as: REVATIO TAKE 3 TO 4 TABLETS (60-'80MG'$ ) BY MOUTH DAILY AS NEEDED.           Objective:   Physical Exam BP 128/80 (BP Location: Left Arm, Patient Position: Sitting, Cuff Size: Normal)   Pulse 70   Temp 98 F (36.7 C) (Oral)   Resp 16   Ht '6\' 1"'$  (1.854 m)   Wt 194 lb (88 kg)   SpO2 97%   BMI 25.60 kg/m  General: Well developed, NAD, BMI noted Neck: No  thyromegaly  HEENT:  Normocephalic . Face symmetric, atraumatic Lungs:  CTA B Normal respiratory effort, no intercostal retractions, no accessory muscle use. Heart: RRR,  no murmur.  Abdomen:  Not distended, soft, non-tender.  No rebound or rigidity.   Lower extremities: no pretibial edema bilaterally  Skin: Exposed areas without rash. Not pale. Not jaundice Neurologic:  alert & oriented X3.  Speech normal, gait appropriate for age and unassisted Strength symmetric and appropriate for age.  Psych: Cognition and judgment appear intact.  Cooperative with normal attention span and concentration.  Behavior appropriate. No anxious or depressed appearing.     Assessment     Assessment Increased PSA, BX 06-2009: atypical cells. BX again 08-2009 (-) Abnormal CT chest: --Nodules  : Stable per CT 2013, no further CTs if risk is low  --Elevated diaphragma, chronic --Mild ascending aortic aneurysm: CT 04/10/2015 stable. CT chest f/u 2021 Lipomas, multiple, had 2 biopsies when he was a teenager, negative ED   PLAN: Here for CPX Ascending aortic aneurysm, pulmonary nodules, 08/29/2020, Saw vascular surgery, ascending aortic aneurysm unchanged.  Recommend good BP control and avoid Valsalva maneuvers. LUL pulmonary  nodule decrease in size, likely benign. Increased PSAs: See comments under CPX Next visit 1 year  This visit occurred during the SARS-CoV-2 public health emergency.  Safety protocols were in place, including screening questions prior to the visit, additional usage of staff PPE, and extensive cleaning of exam room while observing appropriate contact time as indicated for disinfecting solutions.

## 2020-11-13 NOTE — Patient Instructions (Signed)
Check your blood pressure from time to time BP GOAL is between 110/65 and  135/85. If it is consistently higher or lower, let me know     GO TO THE LAB : Get the blood work     [place], [place] back for your second shingles shot in 2 to 3 months.  Come back for a physical exam in 1 year

## 2020-11-14 ENCOUNTER — Encounter: Payer: Self-pay | Admitting: Internal Medicine

## 2020-11-14 NOTE — Assessment & Plan Note (Signed)
-  Td 2021 - shingrix #1 today, next in 2-3 m To 3 months -Had Covid vax x 3, booster rec - Rec flu shot q year - CCS: colonoscopy 2014, next 10 years per GI letter -Prostate cancer screening:  Saw urology [phone] for follow-up on elevated PSA, PSA was noted to be back to baseline at 2.0. -Labs: CMP, FLP, CBC  -Diet and exercise: Counseled, doing well. -POA: Recommend to bring a copy

## 2020-11-14 NOTE — Assessment & Plan Note (Signed)
Here for CPX Ascending aortic aneurysm, pulmonary nodules, 08/29/2020, Saw vascular surgery, ascending aortic aneurysm unchanged.  Recommend good BP control and avoid Valsalva maneuvers. LUL pulmonary nodule decrease in size, likely benign. Increased PSAs: See comments under CPX Next visit 1 year

## 2021-01-23 ENCOUNTER — Ambulatory Visit: Payer: 59

## 2021-01-25 ENCOUNTER — Ambulatory Visit (INDEPENDENT_AMBULATORY_CARE_PROVIDER_SITE_OTHER): Payer: 59

## 2021-01-25 ENCOUNTER — Other Ambulatory Visit: Payer: Self-pay

## 2021-01-25 DIAGNOSIS — Z23 Encounter for immunization: Secondary | ICD-10-CM

## 2021-01-25 NOTE — Progress Notes (Signed)
Pt is here today for 2nd shingle vaccine. Pt was given shingle vaccine in left deltoid. Pt tolerated well

## 2021-03-02 ENCOUNTER — Encounter: Payer: Self-pay | Admitting: Internal Medicine

## 2021-03-06 ENCOUNTER — Other Ambulatory Visit: Payer: Self-pay

## 2021-03-06 ENCOUNTER — Ambulatory Visit: Payer: 59 | Admitting: Internal Medicine

## 2021-03-06 ENCOUNTER — Encounter: Payer: Self-pay | Admitting: Internal Medicine

## 2021-03-06 VITALS — BP 152/94 | HR 87 | Temp 98.2°F | Resp 16 | Ht 73.0 in | Wt 200.5 lb

## 2021-03-06 DIAGNOSIS — R21 Rash and other nonspecific skin eruption: Secondary | ICD-10-CM

## 2021-03-06 NOTE — Progress Notes (Signed)
Subjective:    Patient ID: Nicholas Carrillo, male    DOB: 1960/12/31, 60 y.o.   MRN: [id number]  DOS:  03/06/2021 Type of visit - description: Acute Has a complicated history, see patient messages:  He spent multiple weeks a year abroad, most recent trips included [place] and [place]. In [place] he only stay in urban areas, did have protected sex x 1; then went  to [place] (beach). While in [place], he went to [place].  He returned to the [place] 02/27/2021. At the time he felt he had some burning in his face and head. He applied a natural herbal moisturizer. The next day he had a rash on the face, scalp, hands.  Then he also developed a rash on the feet. The rash is described as a deep-seated blisters, some of them bloody, not painful, not itchy.  Went to urgent care.  He was concerned about monkey box, swabs and labs were done  . Was Rx doxycycline but the prescription never reached the pharmacy so he has not taken any medications. Rash is spontaneously resolving.   No fever chills No nausea, vomiting, diarrhea. Mild cough which is not unusual to him. Denies any genital rash, genital discharge.   Also, he was diagnosed with COVID on October 2025. Had a shingles vaccine 01/25/2021.  See media section for multiple pictures of her hands, feet, scalp, face.             Review of Systems See above   Past Medical History:  Diagnosis Date   Increased prostate specific antigen (PSA) velocity    prostate BX 3-11 (atypical cells) and 08-2009 negative   Multiple lipomas     Past Surgical History:  Procedure Laterality Date   CLOSED MANIPULATION SHOULDER Right 09/24/10   COLONOSCOPY  09/22/2012   polyp BUT not pre-cancerous recall 2024   [place]   located at the thimus, had a sternotomy   [place]   tumor not w/i the joint   NASAL SEPTUM SURGERY  1980   PROSTATE BIOPSY     x 2 ---> 2011    Allergies as of 03/06/2021   No Known  Allergies      Medication List        Accurate as of March 06, 2021  2:36 PM. If you have any questions, ask your nurse or doctor.          sildenafil 20 MG tablet Commonly known as: REVATIO TAKE 3 TO 4 TABLETS (60-80MG ) BY MOUTH DAILY AS NEEDED.           Objective:   Physical Exam BP (!) 152/94 (BP Location: Left Arm, Patient Position: Sitting, Cuff Size: Small)   Pulse 87   Temp 98.2 F (36.8 C) (Oral)   Resp 16   Ht 6\' 1"  (1.854 m)   Wt 200 lb 8 oz (90.9 kg)   SpO2 97%   BMI 26.45 kg/m  General:   Well developed, NAD, BMI noted. HEENT:  Normocephalic . Face symmetric, atraumatic Neck: No thyromegaly Lymphatic system: No LAD's at the neck, supraclavicular area, groins or armpits Lungs:  CTA B Normal respiratory effort, no intercostal retractions, no accessory muscle use. Heart: RRR,  no murmur.  Lower extremities: no pretibial edema bilaterally Abdomen: No organomegaly Skin: Area where he had a rash is drying up.  He has residual firm lumps. Genitals: No lesions at all. Neurologic:  alert & oriented X3.  Speech normal, gait  appropriate for age and unassisted Psych--  Cognition and judgment appear intact.  Cooperative with normal attention span and concentration.  Behavior appropriate. No anxious or depressed appearing.      Assessment       Assessment Increased PSA, BX 06-2009: atypical cells. BX again 08-2009 (-) Abnormal CT chest: --Nodules  : Stable per CT 2013, no further CTs if risk is low  --Elevated diaphragma, chronic --Mild ascending aortic aneurysm: CT 04/10/2015 stable. CT chest f/u 2021 Lipomas, multiple, had 2 biopsies when he was a teenager, negative ED   PLAN: Rash: Rash as described above in the context of a COVID infection October 2022, trips to [place] including a beach and [employer]. Went to urgent care, swabs & extensive (per pt) labs were done, patient was concerned about pox . Fortunately, rash is  resolving, he looks well, no systemic symptoms. Etiology unclear and the differential diagnosis is broad. Plan: Refer to ID, obtain labs from the urgent care, call if symptoms resurface  Time spent 32 minutes, taking a detailed history mostly   This visit occurred during the SARS-CoV-2 public health emergency.  Safety protocols were in place, including screening questions prior to the visit, additional usage of staff PPE, and extensive cleaning of exam room while observing appropriate contact time as indicated for disinfecting solutions.

## 2021-03-06 NOTE — Patient Instructions (Signed)
Please get the records from the urgent care  If you have new rashes, fever chills please call my office   We are returning you to the  Infectious diseases doctors

## 2021-03-07 NOTE — Assessment & Plan Note (Addendum)
Assessment Increased PSA, BX 06-2009: atypical cells. BX again 08-2009 (-) Abnormal CT chest: --Nodules  : Stable per CT 2013, no further CTs if risk is low  --Elevated diaphragma, chronic --Mild ascending aortic aneurysm: CT 04/10/2015 stable. CT chest f/u 2021 Lipomas, multiple, had 2 biopsies when he was a teenager, negative ED   PLAN: Rash: Rash as described above in the context of a COVID infection October 2022, trips to [place] including a beach and [place]. Went to urgent care, swabs & extensive (per pt) labs were done, patient was concerned about pox . Fortunately, rash is resolving, he looks well, no systemic symptoms. Etiology unclear and the differential diagnosis is broad. Plan: Refer to ID, obtain labs from the urgent care, call if symptoms resurface

## 2021-03-11 ENCOUNTER — Other Ambulatory Visit: Payer: Self-pay

## 2021-03-11 ENCOUNTER — Encounter: Payer: Self-pay | Admitting: Internal Medicine

## 2021-03-11 ENCOUNTER — Ambulatory Visit (INDEPENDENT_AMBULATORY_CARE_PROVIDER_SITE_OTHER): Payer: 59 | Admitting: Internal Medicine

## 2021-03-11 ENCOUNTER — Other Ambulatory Visit (HOSPITAL_COMMUNITY)
Admission: RE | Admit: 2021-03-11 | Discharge: 2021-03-11 | Disposition: A | Discharge status: Home / Self Care | Payer: 59 | Source: Ambulatory Visit | Attending: Internal Medicine | Admitting: Internal Medicine

## 2021-03-11 VITALS — BP 148/87 | HR 90 | Temp 97.8°F | Wt 210.0 lb

## 2021-03-11 DIAGNOSIS — R21 Rash and other nonspecific skin eruption: Secondary | ICD-10-CM

## 2021-03-11 DIAGNOSIS — Z113 Encounter for screening for infections with a predominantly sexual mode of transmission: Secondary | ICD-10-CM | POA: Diagnosis not present

## 2021-03-11 NOTE — Addendum Note (Signed)
Addended by: Caffie Pinto on: 03/11/2021 04:43 PM   Modules accepted: Orders

## 2021-03-11 NOTE — Progress Notes (Signed)
[place] for Infectious Disease  Reason for Consult:rash Referring Provider: [facility] medical center    Patient Active Problem List   Diagnosis Date Noted   PCP NOTES >>>>>>> 04/18/2015   Annual physical exam 12/15/2011   Abnormal CT scan, chest 12/15/2011   Increased prostate specific antigen (PSA) velocity       HPI:  Nicholas Carrillo is a 60 y.o. male referred here for rash after returning from [place]  Patient was in [place] before that from 10/05-10/14 to the [place], then [place] 11/02-11/08. He was also in [place], [place], [place], [place], and [place] within the past 3 months  He developed a rash at first thought to be sunburn on his head, first noticed 11/09-10. He applied lavender butter and the rash seems to be worse. There are involvements of palms and soles. Some rash appear like hemorrhagic blister, but most rash from pictures showed a maculopapular erythema. On his face appears to be some desquamation   He has mild eczema. No lupus  There was some sexual encounter (protected) at least 1 month prior.  No fever, chill, malaise, n/vdiarrhea, sorethroat, cough, chest pain/abd pain, decreased appetite  He mentioned that in between [place] the last 2 trips he was dx'ed with mild covid 'head cold'   11/13 rmsf igm negative; ?swab of skin of one of lesion showed normal flora -- no species grown.  The rash is receding.  He used some benadryl cream/pill at onset of rash, nothing else.   He was vaccinated for yellow fever. He doesn't recall being vaccinated to typhoid. He didn't take any doxy prophylaxis  [place], and went to [place] when he was in [place]. No jungle, but there was hiking. No fresh water exposure  He ate local restaurants  The rash itch a little bit if he pay attentions or touch it. Not painful.   Rash doesn't involve mouth or penile area. No penile discharge  Patient wans't around any  animals  Review of Systems: ROS All Other ros negative.       Past Medical History:  Diagnosis Date   Increased prostate specific antigen (PSA) velocity    prostate BX 3-11 (atypical cells) and 08-2009 negative   Multiple lipomas     Social History   Tobacco Use   Smoking status: Never   Smokeless tobacco: Never  Substance Use Topics   Alcohol use: Yes    Alcohol/week: 3.0 standard drinks    Types: 3 Glasses of wine per week   Drug use: No    Family History  Adopted: Yes    No Known Allergies  OBJECTIVE: Vitals:   03/11/21 1536  BP: (!) 148/87  Pulse: 90  Temp: 97.8 F (36.6 C)  TempSrc: Temporal  Weight: 210 lb (95.3 kg)   Body mass index is 27.71 kg/m.   Physical Exam General/constitutional: no distress, pleasant HEENT: Normocephalic, PER, Conj Clear, EOMI, Oropharynx clear Neck supple CV: rrr no mrg Lungs: clear to auscultation, normal respiratory effort Abd: Soft, Nontender Ext: no edema Skin: see below for rash Neuro: nonfocal MSK: no peripheral joint swelling/tenderness/warmth; back spines nontender          Lab:  Microbiology:  Serology:  Imaging:   Assessment/plan: Problem List Items Addressed This Visit   None Visit Diagnoses     Rash and nonspecific skin eruption    -  Primary   Relevant Orders   HIV 1 RNA  quant-no reflex-bld   RPR   Fluorescent treponemal ab(fta)-IgG-bld   Mycoplasma pneumoniae Ab, IgM/IgG   Echovirus Panel   Respiratory virus panel   Comprehensive metabolic panel   CBC w/Diff         Wide differential of potential causes. Most likely viral. Will look for hiv/syphilis and reactive process from thing such as gc/chlamydia   Doesn't appear to be a bacterial process. Tick disease possible although he appears rather well for it. Will see if any lft or cbc disturbance to suggest such; either way doesn't appear to be needed dedicated empiric therapy for tick process at this time   Doesn't  appear to be cutaneous larva migrain  While covid rash usually appears vasculitis, this could be but would be atypical.   -labs today -would also repeat std labs and hepatitis for first time in around 4 weeks -patient reports he'll go to [place] again but will be back by 4 weeks -avoid sexual exposure until dx ascertained     Follow-up: Return in about 4 weeks (around 04/08/2021).  Jabier Mutton, [place] [facility] [place] [phone] pager   [phone] cell 03/11/2021, 3:57 PM

## 2021-03-11 NOTE — Patient Instructions (Signed)
Will get urine, blood, and throat swab today  While this is most likely some nonspecific viral process, will make sure to do std screening  Please return in around 4 weeks to repeat testing as some process take a few weeks to show up on labs (including hepatitis)

## 2021-03-12 ENCOUNTER — Telehealth: Payer: Self-pay

## 2021-03-12 NOTE — Telephone Encounter (Signed)
Received medical records from [facility]. Placed in PCP yellow folder for review.

## 2021-03-13 LAB — URINE CYTOLOGY ANCILLARY ONLY
Chlamydia: NEGATIVE
Comment: NEGATIVE
Comment: NEGATIVE
Comment: NORMAL
Neisseria Gonorrhea: NEGATIVE
Trichomonas: NEGATIVE

## 2021-03-13 NOTE — Telephone Encounter (Signed)
Labs reviewed. [facility] spotted fever 0.63, I believe negative (normal range 0.00-0.89 index) - Lyme titers not found on the reports I got. - Wound culture show it Acinetobacter Lwoffii pansensitive.  Significant of disease unclear, he has been afebrile and getting better. - Other labs okay. - Will CC infectious diseases Dr. Gale Journey

## 2021-03-20 LAB — RESPIRATORY VIRUS PANEL
Adenovirus B: NOT DETECTED
HUMAN PARAINFLU VIRUS 1: NOT DETECTED
HUMAN PARAINFLU VIRUS 2: NOT DETECTED
HUMAN PARAINFLU VIRUS 3: NOT DETECTED
INFLUENZA A SUBTYPE H1: NOT DETECTED
INFLUENZA A SUBTYPE H3: NOT DETECTED
Influenza A: NOT DETECTED
Influenza B: NOT DETECTED
Metapneumovirus: NOT DETECTED
Respiratory Syncytial Virus A: NOT DETECTED
Respiratory Syncytial Virus B: NOT DETECTED
Rhinovirus: DETECTED — AB

## 2021-03-20 LAB — COMPREHENSIVE METABOLIC PANEL
AG Ratio: 1.6 (calc) (ref 1.0–2.5)
ALT: 19 U/L (ref 9–46)
AST: 15 U/L (ref 10–35)
Albumin: 4.3 g/dL (ref 3.6–5.1)
Alkaline phosphatase (APISO): 40 U/L (ref 35–144)
BUN: 16 mg/dL (ref 7–25)
CO2: 35 mmol/L — ABNORMAL HIGH (ref 20–32)
Calcium: 9.9 mg/dL (ref 8.6–10.3)
Chloride: 101 mmol/L (ref 98–110)
Creat: 0.97 mg/dL (ref 0.70–1.30)
Globulin: 2.7 g/dL (calc) (ref 1.9–3.7)
Glucose, Bld: 102 mg/dL — ABNORMAL HIGH (ref 65–99)
Potassium: 3.8 mmol/L (ref 3.5–5.3)
Sodium: 142 mmol/L (ref 135–146)
Total Bilirubin: 0.6 mg/dL (ref 0.2–1.2)
Total Protein: 7 g/dL (ref 6.1–8.1)

## 2021-03-20 LAB — CBC WITH DIFFERENTIAL/PLATELET
Absolute Monocytes: 485 cells/uL (ref 200–950)
Basophils Absolute: 57 cells/uL (ref 0–200)
Basophils Relative: 0.9 %
Eosinophils Absolute: 101 cells/uL (ref 15–500)
Eosinophils Relative: 1.6 %
HCT: 43.6 % (ref 38.5–50.0)
Hemoglobin: 15.4 g/dL (ref 13.2–17.1)
Lymphs Abs: 1600 cells/uL (ref 850–3900)
MCH: 32.6 pg (ref 27.0–33.0)
MCHC: 35.3 g/dL (ref 32.0–36.0)
MCV: 92.2 fL (ref 80.0–100.0)
MPV: 9.3 fL (ref 7.5–12.5)
Monocytes Relative: 7.7 %
Neutro Abs: 4057 cells/uL (ref 1500–7800)
Neutrophils Relative %: 64.4 %
Platelets: 326 10*3/uL (ref 140–400)
RBC: 4.73 10*6/uL (ref 4.20–5.80)
RDW: 13.3 % (ref 11.0–15.0)
Total Lymphocyte: 25.4 %
WBC: 6.3 10*3/uL (ref 3.8–10.8)

## 2021-03-20 LAB — HIV-1 RNA QUANT-NO REFLEX-BLD
HIV 1 RNA Quant: NOT DETECTED Copies/mL
HIV-1 RNA Quant, Log: NOT DETECTED Log cps/mL

## 2021-03-20 LAB — ECHOVIRUS ANTIBODY PANEL,SERUM
ECHOVIRUS 11 AB: <1:8 {titer}
ECHOVIRUS 30 AB: <1:8 {titer}
ECHOVIRUS 4 AB: <1:8 {titer}
ECHOVIRUS 7 AB: <1:8 {titer}
ECHOVIRUS 9 AB: <1:8 {titer}

## 2021-03-20 LAB — RPR: RPR Ser Ql: NONREACTIVE

## 2021-03-20 LAB — MYCOPLASMA PNEUMONIAE AB, IGM/IGG
M. pneumoniae Ab, IgG: 4.98 — ABNORMAL HIGH (ref <=0.90)
Mycoplasma pneumo IgM: 192 U/mL (ref <770)

## 2021-04-01 DIAGNOSIS — R21 Rash and other nonspecific skin eruption: Secondary | ICD-10-CM

## 2021-04-04 ENCOUNTER — Other Ambulatory Visit: Payer: 59

## 2021-07-30 ENCOUNTER — Other Ambulatory Visit: Payer: Self-pay | Admitting: Surgery

## 2021-07-30 DIAGNOSIS — I7121 Aneurysm of the ascending aorta, without rupture: Secondary | ICD-10-CM

## 2021-09-18 ENCOUNTER — Ambulatory Visit: Payer: 59 | Admitting: Surgery

## 2021-09-18 ENCOUNTER — Ambulatory Visit
Admission: RE | Admit: 2021-09-18 | Discharge: 2021-09-18 | Disposition: A | Discharge status: Home / Self Care | Payer: 59 | Source: Ambulatory Visit | Attending: Surgery | Admitting: Surgery

## 2021-09-18 ENCOUNTER — Encounter: Payer: Self-pay | Admitting: Surgery

## 2021-09-18 VITALS — BP 160/87 | HR 67 | Resp 20 | Wt 192.0 lb

## 2021-09-18 DIAGNOSIS — I7121 Aneurysm of the ascending aorta, without rupture: Secondary | ICD-10-CM

## 2021-09-18 MED ORDER — IOPAMIDOL (ISOVUE-370) INJECTION 76%
75.0000 mL | Freq: Once | INTRAVENOUS | Status: AC | PRN
  Administered 2021-09-18: 75 mL via INTRAVENOUS

## 2021-09-22 NOTE — Progress Notes (Signed)
HPI:  The patient is a 61 year old gentleman who has been followed for a 4.8 cm fusiform ascending aortic aneurysm.  He continues to feel well without chest or back pain and no shortness of breath.  Echocardiogram in November 2021 showed a trileaflet aortic valve with no regurgitation or stenosis.  Current Outpatient Medications  Medication Sig Dispense Refill   sildenafil (REVATIO) 20 MG tablet TAKE 3 TO 4 TABLETS (60-'80MG'$ ) BY MOUTH DAILY AS NEEDED. 30 tablet 5   No current facility-administered medications for this visit.     Physical Exam: BP (!) 160/87 (BP Location: Right Arm, Patient Position: Sitting)   Pulse 67   Resp 20   Wt 192 lb (87.1 kg)   SpO2 96% Comment: RA  BMI 25.33 kg/m  He looks well. Cardiac exam shows regular rate and rhythm with normal heart sounds.  There is no murmur. Lungs are clear.  Diagnostic Tests:  Narrative & Impression  CLINICAL DATA:  Known thoracic aortic aneurysm   EXAM: CT ANGIOGRAPHY CHEST WITH CONTRAST   TECHNIQUE: Multidetector CT imaging of the chest was performed using the standard protocol during bolus administration of intravenous contrast. Multiplanar CT image reconstructions and MIPs were obtained to evaluate the vascular anatomy.   RADIATION DOSE REDUCTION: This exam was performed according to the departmental dose-optimization program which includes automated exposure control, adjustment of the mA and/or kV according to patient size and/or use of iterative reconstruction technique.   CONTRAST:  2m ISOVUE-370 IOPAMIDOL (ISOVUE-370) INJECTION 76%   COMPARISON:  08/24/2020, 12/12/2019   FINDINGS: Cardiovascular: There is a stable 4.8 cm ascending thoracic aortic aneurysm, without evidence of dissection.   The heart is unremarkable without pericardial effusion. There is adequate opacification of the pulmonary vasculature. No filling defects or pulmonary emboli.   Mediastinum/Nodes: No enlarged mediastinal,  hilar, or axillary lymph nodes. Thyroid gland, trachea, and esophagus demonstrate no significant findings.   Lungs/Pleura: Chronic elevation of the left hemidiaphragm. No acute airspace disease, effusion, or pneumothorax. Multiple pulmonary nodules are again identified:   Left upper lobe, solid nodule, image 27/18, 6 x 6 mm. Increased since 08/24/2020 but stable since 12/12/2019.   Left lower lobe, solid nodule, image 36/8, 4 mm.  Stable.   Right middle lobe, solid nodule, image 108/8, 4 mm.  Stable.   There are multiple sub 4 mm Peri fissural lymph nodes seen along the major fissures, stable. Central airways are patent.   Upper Abdomen: Stable hepatic cysts. No acute upper abdominal findings.   Musculoskeletal: No acute or destructive bony lesions. Reconstructed images demonstrate no additional findings.   Review of the MIP images confirms the above findings.   IMPRESSION: 1. Stable 4.8 cm ascending thoracic aortic aneurysm. Ascending thoracic aortic aneurysm. Recommend semi-annual imaging followup by CTA or MRA and referral to cardiothoracic surgery if not already obtained. This recommendation follows 2010 ACCF/AHA/AATS/ACR/ASA/SCA/SCAI/SIR/STS/SVM Guidelines for the Diagnosis and Management of Patients With Thoracic Aortic Disease. Circulation. 2010; 121:: W413-K440 Aortic aneurysm NOS (ICD10-I71.9) 2. Waxing and waning appearance to a 6 mm left upper lobe pulmonary nodule, similar in appearance to 12/12/2019 exam. Remaining pulmonary nodules and Peri fissural lymph nodes are stable. Given long-term stability, these are consistent with benign nodules and no specific imaging follow-up is required.     Electronically Signed   By: MRanda NgoM.D.   On: 09/18/2021 15:50      Impression:  He has a stable 4.8 cm fusiform ascending aortic aneurysm.  It is unchanged from 1 year  ago.  I measured this at 4.5 cm in 2016.  This is still well below the surgical threshold of  5.5 cm in a patient with a trileaflet aortic valve.  I reviewed the CTA images with him and answered his questions.  I stressed the importance of continued good blood pressure control in preventing further enlargement and acute aortic dissection.  I advised him against doing any heavy lifting that may result in a Valsalva maneuver and a sudden rise in his blood pressure to high levels.  Plan:  I will see him back in 1 year with a CTA of the chest.  I spent 15 minutes performing this established patient evaluation and > 50% of this time was spent face to face counseling and coordinating the care of this patient's aortic aneurysm.    Gaye Pollack, MD [facility] [phone]

## 2021-11-15 ENCOUNTER — Encounter: Payer: Self-pay | Admitting: Internal Medicine

## 2021-11-15 ENCOUNTER — Ambulatory Visit (INDEPENDENT_AMBULATORY_CARE_PROVIDER_SITE_OTHER): Payer: 59 | Admitting: Internal Medicine

## 2021-11-15 VITALS — BP 132/86 | HR 76 | Temp 98.1°F | Resp 16 | Ht 73.0 in | Wt 196.4 lb

## 2021-11-15 DIAGNOSIS — R21 Rash and other nonspecific skin eruption: Secondary | ICD-10-CM | POA: Diagnosis not present

## 2021-11-15 DIAGNOSIS — Z0000 Encounter for general adult medical examination without abnormal findings: Secondary | ICD-10-CM | POA: Diagnosis not present

## 2021-11-15 LAB — COMPREHENSIVE METABOLIC PANEL
ALT: 15 U/L (ref 0–53)
AST: 14 U/L (ref 0–37)
Albumin: 4.4 g/dL (ref 3.5–5.2)
Alkaline Phosphatase: 37 U/L — ABNORMAL LOW (ref 39–117)
BUN: 16 mg/dL (ref 6–23)
CO2: 29 mEq/L (ref 19–32)
Calcium: 9.5 mg/dL (ref 8.4–10.5)
Chloride: 106 mEq/L (ref 96–112)
Creatinine, Ser: 0.96 mg/dL (ref 0.40–1.50)
GFR: 85.85 mL/min (ref >60.00)
Glucose, Bld: 94 mg/dL (ref 70–99)
Potassium: 4.7 mEq/L (ref 3.5–5.1)
Sodium: 144 mEq/L (ref 135–145)
Total Bilirubin: 0.7 mg/dL (ref 0.2–1.2)
Total Protein: 6.9 g/dL (ref 6.0–8.3)

## 2021-11-15 LAB — LIPID PANEL
Cholesterol: 209 mg/dL — ABNORMAL HIGH (ref 0–200)
HDL: 41.7 mg/dL (ref >39.00)
LDL Cholesterol: 148 mg/dL — ABNORMAL HIGH (ref 0–99)
NonHDL: 167.01
Total CHOL/HDL Ratio: 5
Triglycerides: 97 mg/dL (ref 0.0–149.0)
VLDL: 19.4 mg/dL (ref 0.0–40.0)

## 2021-11-15 LAB — PSA: PSA: 3.55 ng/mL (ref 0.10–4.00)

## 2021-11-15 MED ORDER — METOPROLOL SUCCINATE ER 25 MG PO TB24: 25.0000 mg | ORAL_TABLET | Freq: Every day | ORAL | 1 refills | Status: DC

## 2021-11-15 NOTE — Patient Instructions (Addendum)
Start taking metoprolol XL 25 mg 1 tablet daily. Check your blood pressure at least weekly. Call if not at goal (around 120/80) most of the time Call if side effects Continue healthy lifestyle    Recommend to proceed with the following vaccines at your pharmacy:  Covid booster (bivalent) Flu shot this fall   Please drop off or mail [place] a copy of your Healthcare Power of Attorney for your chart.     GO TO THE LAB : Get the blood work     [place], [place] Come back for   a physical exam in 1 year

## 2021-11-15 NOTE — Progress Notes (Unsigned)
Subjective:    Patient ID: Nicholas Carrillo, male    DOB: [birth year], 60 y.o.   MRN: [id number]  DOS:  11/15/2021 Type of visit - description: CPX  Since the last office visit is doing well. His BP at home is consistently in the 150s.  Would like better control. He denies any chest pain, difficulty breathing.  Also, was seen with a rash: That is largely resolved. BP Readings from Last 3 Encounters:  11/15/21 132/86  09/18/21 (!) 160/87  03/11/21 (!) 148/87     Review of Systems See above   Past Medical History:  Diagnosis Date   Increased prostate specific antigen (PSA) velocity    prostate BX 3-11 (atypical cells) and 08-2009 negative   Multiple lipomas     Past Surgical History:  Procedure Laterality Date   CLOSED MANIPULATION SHOULDER Right 09/24/10   COLONOSCOPY  09/22/2012   polyp BUT not pre-cancerous recall 2024   [place]   located at the thimus, had a sternotomy   [place]   tumor not w/i the joint   NASAL SEPTUM SURGERY  1980   PROSTATE BIOPSY     x 2 ---> 2011    Current Outpatient Medications  Medication Instructions   sildenafil (REVATIO) 20 MG tablet TAKE 3 TO 4 TABLETS (60-'80MG'$ ) BY MOUTH DAILY AS NEEDED.       Objective:   Physical Exam BP 132/86   Pulse 76   Temp 98.1 F (36.7 C) (Oral)   Resp 16   Ht '6\' 1"'$  (1.854 m)   Wt 196 lb 6 oz (89.1 kg)   SpO2 97%   BMI 25.91 kg/m  General: Well developed, NAD, BMI noted Neck: No  thyromegaly  HEENT:  Normocephalic . Face symmetric, atraumatic Lungs:  CTA B Normal respiratory effort, no intercostal retractions, no accessory muscle use. Heart: RRR,  no murmur.  Abdomen:  Not distended, soft, non-tender. No rebound or rigidity.   Lower extremities: no pretibial edema bilaterally DRE: Normal sphincter tone, brown stools, prostate enlarged but not tender or nodular Skin: Exposed areas without rash. Not pale. Not jaundice Neurologic:  alert & oriented X3.  Speech  normal, gait appropriate for age and unassisted Strength symmetric and appropriate for age.  Psych: Cognition and judgment appear intact.  Cooperative with normal attention span and concentration.  Behavior appropriate. No anxious or depressed appearing.     Assessment     Assessment Increased PSA, BX 06-2009: atypical cells. BX again 08-2009 (-) Abnormal CT chest: --Nodules  : Stable per CT 2013, no further CTs if risk is low  --Elevated diaphragma, chronic --Mild ascending aortic aneurysm: CT 04/10/2015 stable. CT chest f/u 2021 Lipomas, multiple, had 2 biopsies when he was a teenager, negative ED   PLAN: Here for CPX Mild HTN: BP at home is consistently 150s, today is ok but has been elevated at other visits. He has a ascending aortic aneurysm thus would like better BPs. Plan: Start metoprolol XL 25, monitor BPs.  Rash: See last visit, saw ID.  Symptoms resolved, will get blood work as requested by ID. Ascending aortic aneurysm: Last visit with cardiovascular 08/31/2021, next visit 1 year.  See comments under HTN RTC 1 year as long as he feels well BP is controlled.   -Td 2021 - shingrix s/p 2 - Covid vax : had 3 vax, had covid fall 2022, ok to proceed w/ a booster  - Rec flu shot q year - CCS:  colonoscopy 2014, next 10 years per GI letter -Prostate cancer screening:  Saw urology [phone] for follow-up on elevated PSA, PSA was noted to be back to baseline at 2.0.  Does not plan to see urology again.  No symptoms, DRE with a slightly enlarged prostate, check PSA. -Labs: CMP, FLP, PSA, and others, see instructions -Diet and exercise: Counseled, doing well. -POA: Recommend to bring a copy

## 2021-11-17 ENCOUNTER — Encounter: Payer: Self-pay | Admitting: Internal Medicine

## 2021-11-17 NOTE — Assessment & Plan Note (Signed)
Here for CPX Mild HTN: BP at home is consistently 150s, today is ok but has been elevated at other visits. He has a ascending aortic aneurysm thus rec better BPs. Plan: Start metoprolol XL 25, monitor BPs. Rash: See last visit, saw ID.  Symptoms resolved, will get blood work as requested by ID. Ascending aortic aneurysm: Last visit with cardiovascular 08/31/2021, next visit 1 year.  See comments under HTN RTC 1 year as long as he feels well BP is controlled.

## 2021-11-17 NOTE — Assessment & Plan Note (Signed)
-  Td 2021 - shingrix s/p 2 - Covid vax : had 3 vax, had covid fall 2022, ok to proceed w/ a booster  - Rec flu shot q year - CCS: colonoscopy 2014, next 10 years per GI letter -Prostate cancer screening:  Saw urology [phone] for follow-up on elevated PSA, PSA was noted to be back to baseline at 2.0.  Does not plan to see urology again.  No symptoms, DRE with a slightly enlarged prostate, check PSA. -Labs: CMP, FLP, PSA, and others, see orders -Diet and exercise: Counseled, doing well. -POA: Recommend to bring a copy

## 2021-11-18 ENCOUNTER — Other Ambulatory Visit: Payer: Self-pay | Admitting: Internal Medicine

## 2021-11-20 LAB — ECHOVIRUS ANTIBODY PANEL,SERUM
ECHOVIRUS 11 AB: <1:8 {titer}
ECHOVIRUS 30 AB: <1:8 {titer}
ECHOVIRUS 4 AB: <1:8 {titer}
ECHOVIRUS 7 AB: <1:8 {titer}
ECHOVIRUS 9 AB: <1:8 {titer}

## 2021-11-21 LAB — HEPATITIS PANEL, ACUTE
Hep A IgM: NONREACTIVE
Hep B C IgM: NONREACTIVE
Hepatitis B Surface Ag: NONREACTIVE
Hepatitis C Ab: NONREACTIVE

## 2021-11-21 LAB — HIV ANTIBODY (ROUTINE TESTING W REFLEX): HIV 1&2 Ab, 4th Generation: NONREACTIVE

## 2021-11-21 LAB — FLUORESCENT TREPONEMAL AB(FTA)-IGG-BLD: Fluorescent Treponemal ABS: NONREACTIVE

## 2022-07-30 ENCOUNTER — Other Ambulatory Visit: Payer: Self-pay | Admitting: Surgery

## 2022-07-30 DIAGNOSIS — I7121 Aneurysm of the ascending aorta, without rupture: Secondary | ICD-10-CM

## 2022-09-17 ENCOUNTER — Encounter: Payer: Self-pay | Admitting: Internal Medicine

## 2022-09-18 NOTE — Progress Notes (Signed)
Subjective:   By signing my name below, I, [name], attest that this documentation has been prepared under the direction and in the presence of Lemont Fillers, NP 09/20/22   Patient ID: Nicholas Carrillo, male    DOB: 10/10/1960, 62 y.o.   MRN: [id number]  Chief Complaint  Patient presents with   Hypertension    Patient reports getting elevated readings at home.     HPI Patient is in today for an office visit.   Blood pressure: He has been monitoring his blood pressure and states his readings would fluctuate. He reports readings of 155-165 in the morning and readings as high as 200s. He states if he relaxes, he has readings in the 110s-120s. He is prescribed 25 mg Metoprolol but he has been taking 75 mg to manage his hypertension. This morning he only took 25 mg of Metoprolol. He brought his machine with him this visit, which is running 20 digits higher.  He is currently living in [place], [place] and comes back to the [place] every now and then to visit his son.   Past Medical History:  Diagnosis Date   Increased prostate specific antigen (PSA) velocity    prostate BX 3-11 (atypical cells) and 08-2009 negative   Multiple lipomas     Past Surgical History:  Procedure Laterality Date   CLOSED MANIPULATION SHOULDER Right 09/24/10   COLONOSCOPY  09/22/2012   polyp BUT not pre-cancerous recall 2024   HEMANGIOMA EXCISION  1972   located at the thimus, had a sternotomy   KNEE SURGERY  1979   tumor not w/i the joint   NASAL SEPTUM SURGERY  1980   PROSTATE BIOPSY     x 2 ---> 2011    Family History  Adopted: Yes    Social History   Socioeconomic History   Marital status: Married    Spouse name: Not on file   Number of children: 2   Years of education: Not on file   Highest education level: Not on file  Occupational History   Occupation: Attorney    Employer: [employer]  Tobacco Use   Smoking status: Never   Smokeless tobacco: Never  Substance and Sexual  Activity   Alcohol use: Yes    Alcohol/week: 3.0 standard drinks of alcohol    Types: 3 Glasses of wine per week   Drug use: No   Sexual activity: Not on file  Other Topics Concern   Not on file  Social History Narrative   Unknown Family Hx-Adopted.   Occupation: Pensions consultant   Married   Daughter 2001 [employer]   Son , Cornelius Moras 2005   Diet--try to eat healthy      [place] doing charitable work often   EtOH 5/week, no smoking/substances      Social Determinants of Community education officer: Not on [employer] Insecurity: Not on file  Transportation Needs: Not on file  Physical Activity: Not on file  Stress: Not on file  Social Connections: Not on file  Intimate Partner Violence: Not on file    Outpatient Medications Prior to Visit  Medication Sig Dispense Refill   sildenafil (REVATIO) 20 MG tablet TAKE 3 TO 4 TABLETS (60-80MG ) BY MOUTH DAILY AS NEEDED. 30 tablet 5   metoprolol succinate (TOPROL-XL) 25 MG 24 hr tablet Take 1 tablet (25 mg total) by mouth daily. Take with or immediately following a meal 90 tablet 1  No facility-administered medications prior to visit.    No Known Allergies  ROS    See HPI Objective:    Physical Exam Constitutional:      General: He is not in acute distress.    Appearance: He is well-developed.  HENT:     Head: Normocephalic and atraumatic.  Cardiovascular:     Rate and Rhythm: Normal rate and regular rhythm.     Heart sounds: No murmur heard. Pulmonary:     Effort: Pulmonary effort is normal. No respiratory distress.     Breath sounds: Normal breath sounds. No wheezing or rales.  Skin:    General: Skin is warm and dry.  Neurological:     Mental Status: He is alert and oriented to person, place, and time.  Psychiatric:        Behavior: Behavior normal.        Thought Content: Thought content normal.     BP (!) 142/80 (BP Location: Right Arm, Patient Position: Sitting, Cuff Size: Small)   Pulse 77    Temp 97.9 F (36.6 C) (Oral)   Resp 16   Wt 203 lb (92.1 kg)   SpO2 99%   BMI 26.78 kg/m  Wt Readings from Last 3 Encounters:  09/19/22 203 lb (92.1 kg)  11/15/21 196 lb 6 oz (89.1 kg)  09/18/21 192 lb (87.1 kg)       Assessment & Plan:  Primary hypertension Assessment & Plan: His home BP cuff is running about 20 points higher than our manual cuff.  Even still, he reports sbp's as high as 200 at home.  I have advised the patient to purchase a new blood pressure cuff.  Will d/c metoprolol xl and instead place him on carvedilol 12.5 mg bid which should give better BP control.  He heads back to [place] on 09/25/22, and I have asked him to send me some updated readings with his new machine on 6/3 so I can evaluate prior to his departure.    Other orders -     Carvedilol; Take 1 tablet (12.5 mg total) by mouth 2 (two) times daily with a meal.  Dispense: 180 tablet; Refill: 0     I,Rachel Rivera,acting as a scribe for Lemont Fillers, NP.,have documented all relevant documentation on the behalf of Lemont Fillers, NP,as directed by  Lemont Fillers, NP while in the presence of Lemont Fillers, NP.   I, Lemont Fillers, NP, personally preformed the services described in this documentation.  All medical record entries made by the scribe were at my direction and in my presence.  I have reviewed the chart and discharge instructions (if applicable) and agree that the record reflects my personal performance and is accurate and complete. 09/20/22   Lemont Fillers, NP

## 2022-09-19 ENCOUNTER — Ambulatory Visit: Payer: 59 | Admitting: Family

## 2022-09-19 VITALS — BP 142/80 | HR 77 | Temp 97.9°F | Resp 16 | Wt 203.0 lb

## 2022-09-19 DIAGNOSIS — I10 Essential (primary) hypertension: Secondary | ICD-10-CM | POA: Diagnosis not present

## 2022-09-19 MED ORDER — CARVEDILOL 12.5 MG PO TABS: 12.5000 mg | ORAL_TABLET | Freq: Two times a day (BID) | ORAL | 0 refills | Status: DC

## 2022-09-20 DIAGNOSIS — I10 Essential (primary) hypertension: Secondary | ICD-10-CM | POA: Insufficient documentation

## 2022-09-20 NOTE — Assessment & Plan Note (Signed)
His home BP cuff is running about 20 points higher than our manual cuff.  Even still, he reports sbp's as high as 200 at home.  I have advised the patient to purchase a new blood pressure cuff.  Will d/c metoprolol xl and instead place him on carvedilol 12.5 mg bid which should give better BP control.  He heads back to [place] on 09/25/22, and I have asked him to send me some updated readings with his new machine on 6/3 so I can evaluate prior to his departure.

## 2022-09-22 ENCOUNTER — Ambulatory Visit
Admission: RE | Admit: 2022-09-22 | Discharge: 2022-09-22 | Disposition: A | Discharge status: Home / Self Care | Payer: 59 | Source: Ambulatory Visit | Attending: Thoracic Surgery (Cardiothoracic Vascular Surgery) | Admitting: Thoracic Surgery (Cardiothoracic Vascular Surgery)

## 2022-09-22 DIAGNOSIS — I7121 Aneurysm of the ascending aorta, without rupture: Secondary | ICD-10-CM

## 2022-09-22 MED ORDER — IOPAMIDOL (ISOVUE-370) INJECTION 76%
75.0000 mL | Freq: Once | INTRAVENOUS | Status: AC | PRN
  Administered 2022-09-22: 75 mL via INTRAVENOUS

## 2022-09-23 ENCOUNTER — Telehealth: Payer: Self-pay | Admitting: Family

## 2022-09-23 ENCOUNTER — Other Ambulatory Visit: Payer: Self-pay | Admitting: Family

## 2022-09-23 MED ORDER — CARVEDILOL 12.5 MG PO TABS: 18.7500 mg | ORAL_TABLET | Freq: Two times a day (BID) | ORAL | 0 refills | Status: DC

## 2022-09-23 NOTE — Telephone Encounter (Signed)
See mychart.  

## 2022-09-24 ENCOUNTER — Ambulatory Visit: Payer: 59 | Admitting: Surgery

## 2022-09-24 VITALS — BP 131/79 | HR 61 | Resp 18 | Ht 73.0 in | Wt 203.0 lb

## 2022-09-24 DIAGNOSIS — I7121 Aneurysm of the ascending aorta, without rupture: Secondary | ICD-10-CM

## 2022-09-25 ENCOUNTER — Encounter: Payer: Self-pay | Admitting: Surgery

## 2022-09-25 NOTE — Progress Notes (Signed)
HPI:  The patient is a 61 year old gentleman followed with a 4.8 cm fusiform ascending aortic aneurysm.  This was measured at 4.5 cm in 2016.  Echocardiogram in 2021 showed a trileaflet aortic valve with no regurgitation or stenosis.  Left ventricular ejection fraction was 50 to 55% with mild LVH and grade 1 diastolic dysfunction.  He continues to feel well but has noted that his blood pressures have been elevated at home.  Current Outpatient Medications  Medication Sig Dispense Refill   carvedilol (COREG) 12.5 MG tablet Take 1.5 tablets (18.75 mg total) by mouth 2 (two) times daily with a meal. 180 tablet 0   sildenafil (REVATIO) 20 MG tablet TAKE 3 TO 4 TABLETS (60-80MG ) BY MOUTH DAILY AS NEEDED. 30 tablet 5   No current facility-administered medications for this visit.     Physical Exam: BP 131/79 (BP Location: Left Arm, Patient Position: Sitting)   Pulse 61   Resp 18   Ht 6\' 1"  (1.854 m)   Wt 203 lb (92.1 kg)   SpO2 95% Comment: RA  BMI 26.78 kg/m  He looks well. Cardiac exam shows a regular rate and rhythm with normal heart sounds.  There is no murmur. Lungs are clear.  Diagnostic Tests:  Narrative & Impression  CLINICAL DATA:  Thoracic aortic aneurysm.   EXAM: CT ANGIOGRAPHY CHEST WITH CONTRAST   TECHNIQUE: Multidetector CT imaging of the chest was performed using the standard protocol during bolus administration of intravenous contrast. Multiplanar CT image reconstructions and MIPs were obtained to evaluate the vascular anatomy.   RADIATION DOSE REDUCTION: This exam was performed according to the departmental dose-optimization program which includes automated exposure control, adjustment of the mA and/or kV according to patient size and/or use of iterative reconstruction technique.   CONTRAST:  75mL ISOVUE-370 IOPAMIDOL (ISOVUE-370) INJECTION 76%   COMPARISON:  Sep 18, 2021.   FINDINGS: Cardiovascular: 4.6 cm ascending thoracic aortic aneurysm is  noted best seen on image number 72 of series 4. No dissection is noted. Normal cardiac size. No pericardial effusion.   Mediastinum/Nodes: No enlarged mediastinal, hilar, or axillary lymph nodes. Thyroid gland, trachea, and esophagus demonstrate no significant findings.   Lungs/Pleura: No pneumothorax or pleural effusion is noted. Stable elevation of left hemidiaphragm. Multiple small nodules are noted bilaterally which are unchanged compared to prior exam and can be considered benign at this point. No acute abnormality is noted.   Upper Abdomen: No acute abnormality.   Musculoskeletal: No chest wall abnormality. No acute or significant osseous findings.   Review of the MIP images confirms the above findings.   IMPRESSION: 4.6 cm Ascending thoracic aortic aneurysm. Recommend semi-annual imaging followup by CTA or MRA and referral to cardiothoracic surgery if not already obtained. This recommendation follows 2010 ACCF/AHA/AATS/ACR/ASA/SCA/SCAI/SIR/STS/SVM Guidelines for the Diagnosis and Management of Patients With Thoracic Aortic Disease. Circulation. 2010; 121: Z610-R604. Aortic aneurysm NOS (ICD10-I71.9).     Electronically Signed   By: Lupita Raider M.D.   On: 09/22/2022 10:37      Impression:  He has a stable 4.6 cm fusiform ascending aortic aneurysm which is measured slightly smaller than it was 1 year ago.  This is well below the surgical threshold of 5.5 cm.  I reviewed the CTA images with him and answered his questions.  I stressed the importance of continued good blood pressure control in preventing further enlargement and acute aortic dissection.  I advised him against doing any heavy lifting that may require a Valsalva maneuver  and could suddenly raise his blood pressure to high levels.  Plan:  I will see him back in 1 year with a CTA of the chest for aortic surveillance.  His blood pressure will continue to be followed by his PCP.  I spent 10 minutes  performing this established patient evaluation and > 50% of this time was spent face to face counseling and coordinating the care of this patient's aortic aneurysm.    Alleen Borne, MD [facility] [phone]

## 2022-09-29 ENCOUNTER — Encounter: Payer: Self-pay | Admitting: Internal Medicine

## 2022-10-20 IMAGING — CT CT CHEST W/O CM
1 series · 15 of 34 positions shown, 19 images · non-contrast
Comparison: Multiple priors including CT chest December 12, 2019.

CLINICAL DATA: Follow-up pulmonary nodules

EXAM:
CT CHEST WITHOUT CONTRAST
TECHNIQUE: Multidetector CT imaging of the chest was performed following the
standard protocol without IV contrast.

[Series 2: chest w/(date) · axial · 0.78mm/px · z∈[-394,-72]mm · 15 of 189 slices shown, 19 images]
[im 14/189  mediastinal]
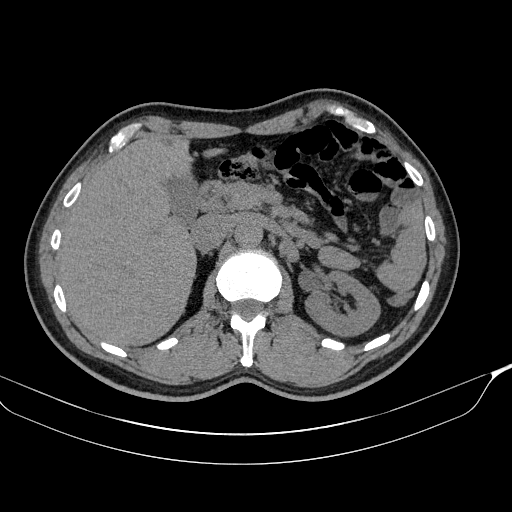
[im 14/189  lung]
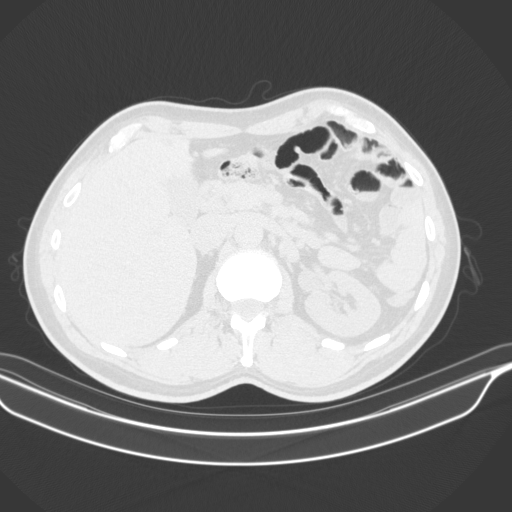
[im 28/189  lung]
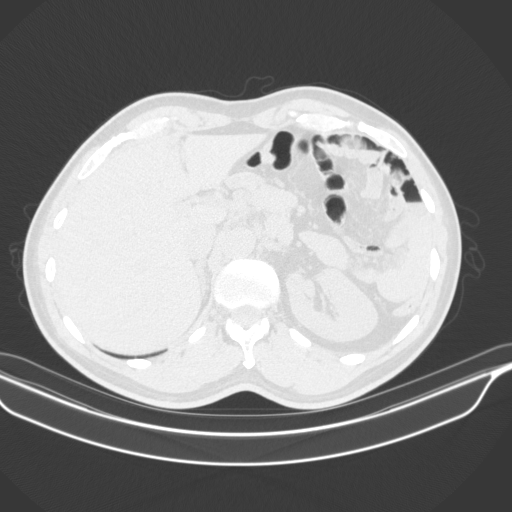
[im 38/189  lung]
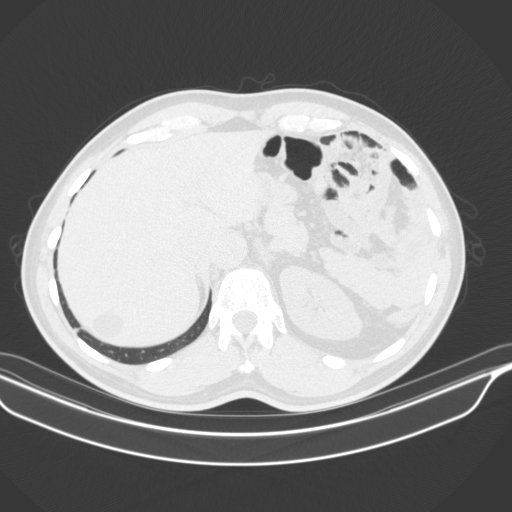
[im 49/189  lung]
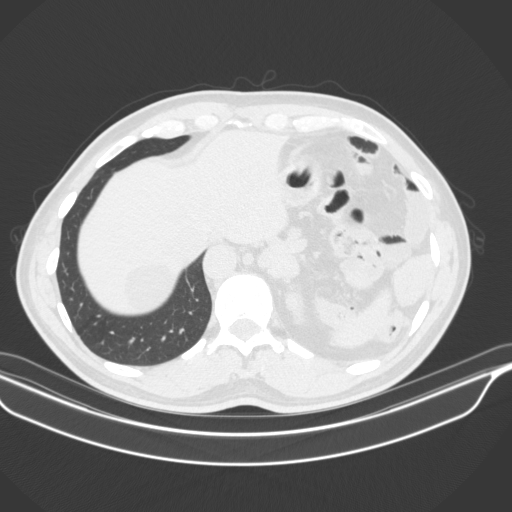
[im 63/189  mediastinal]
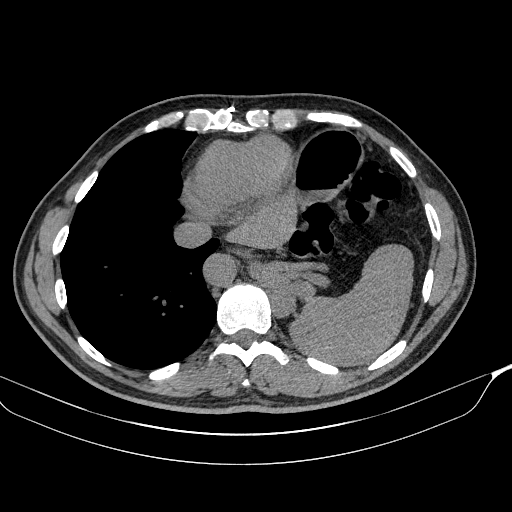
[im 63/189  lung]
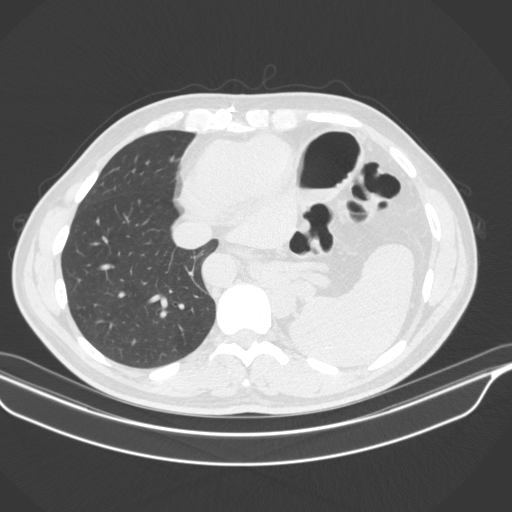
[im 76/189  lung]
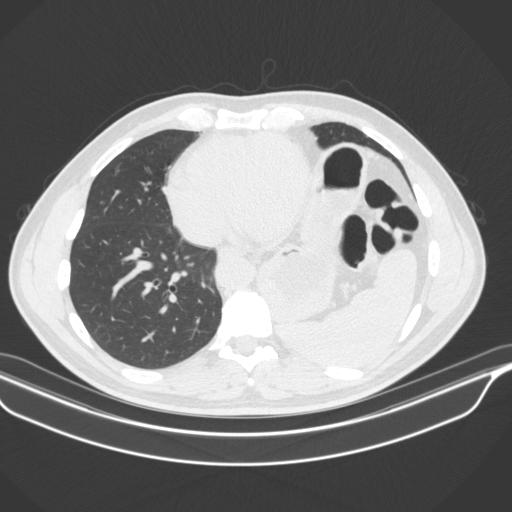
[im 84/189  lung]
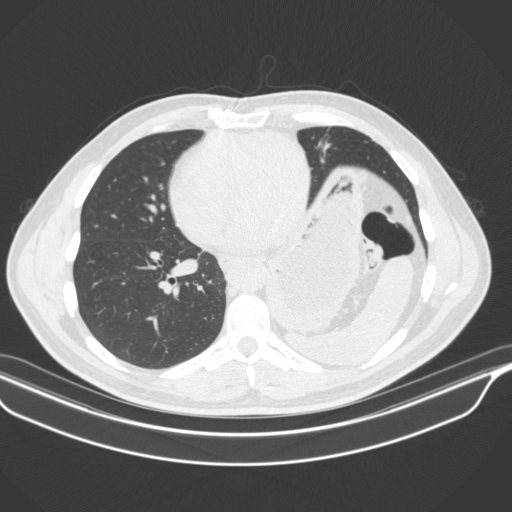
[im 98/189  lung]
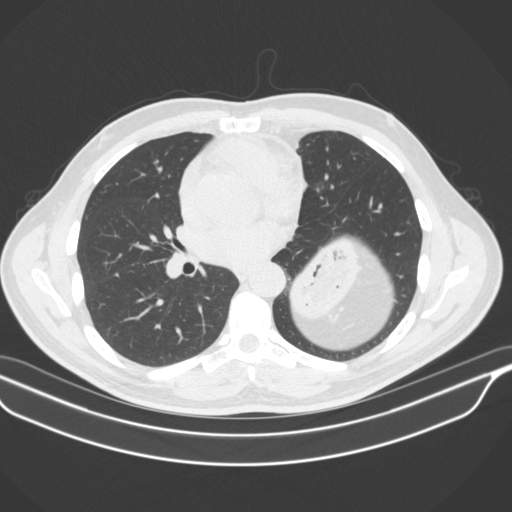
[im 105/189  mediastinal]
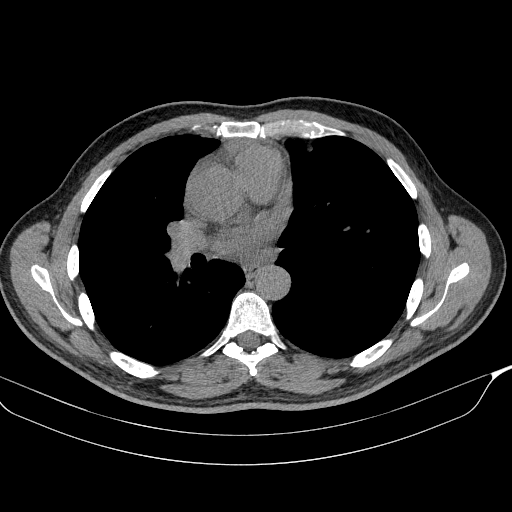
[im 105/189  lung]
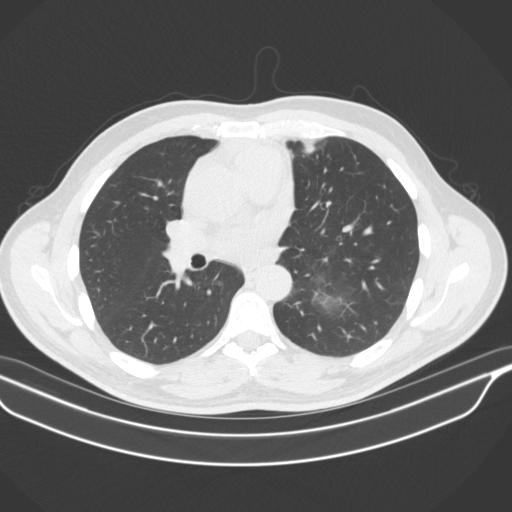
[im 113/189  lung]
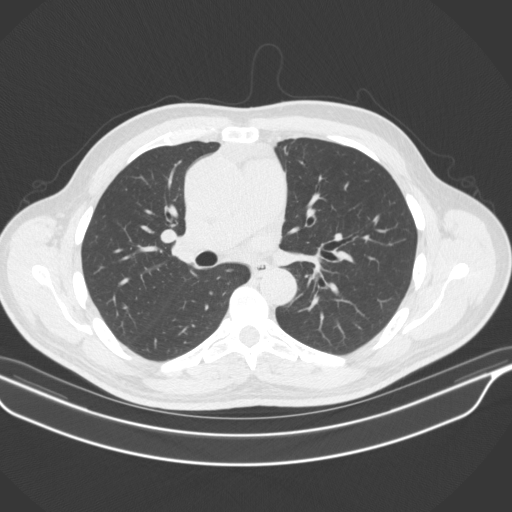
[im 126/189  lung]
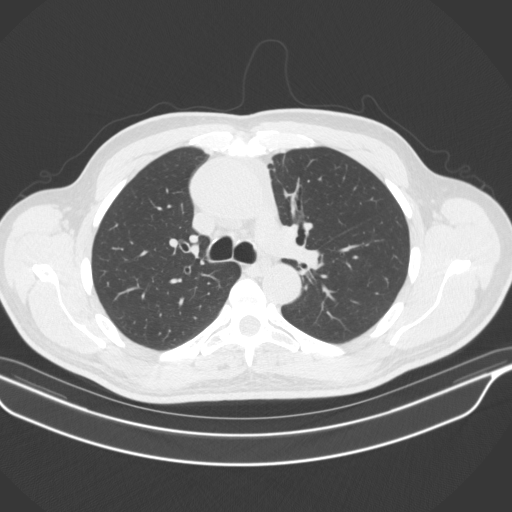
[im 140/189  lung]
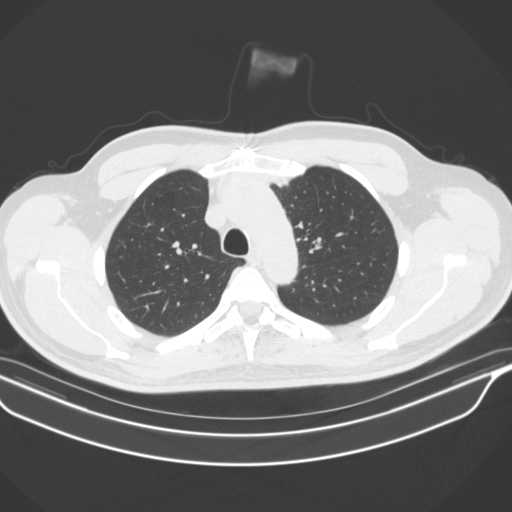
[im 151/189  mediastinal]
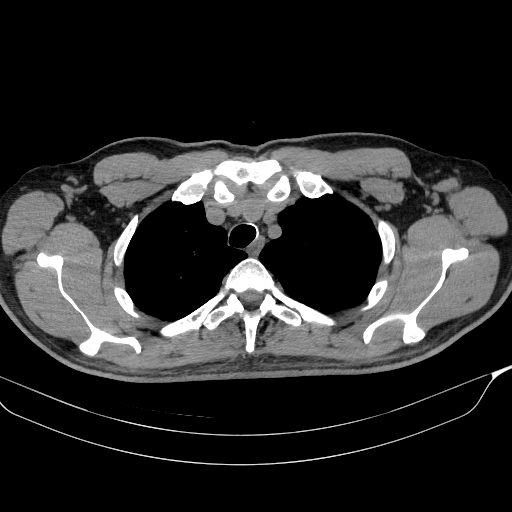
[im 151/189  lung]
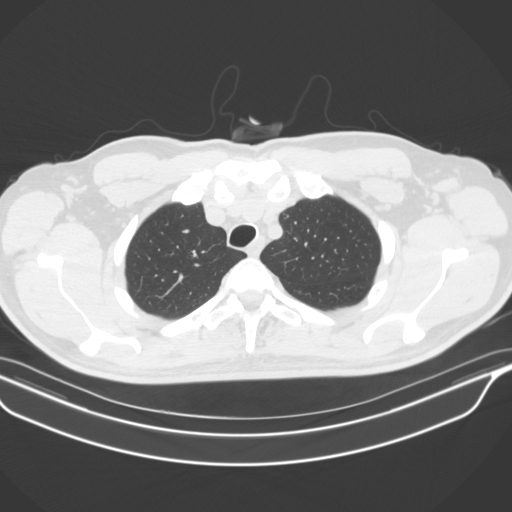
[im 161/189  lung]
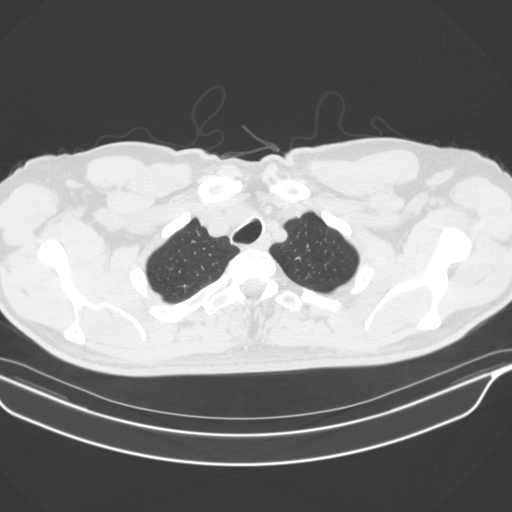
[im 175/189  lung]
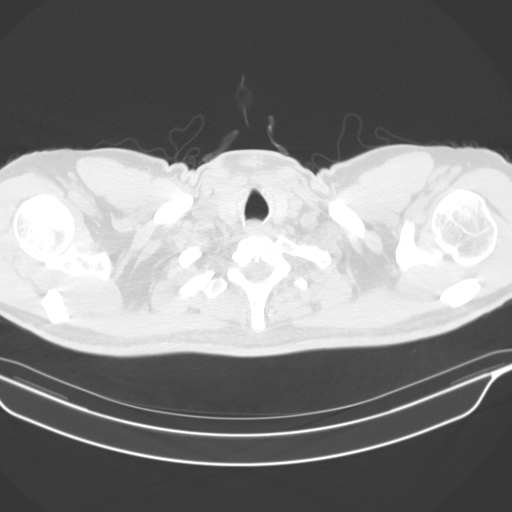

[15 of 34 positions shown; findings below may reference images not displayed]

FINDINGS: Cardiovascular: Unchanged aneurysmal dilation of the ascending aorta
measuring 4.8 cm in maximum axial diameter. The aortic arch and
descending thoracic aorta are normal in caliber. Normal size heart.

Mediastinum/Nodes: No suspicious thyroid right nodularity. No
pathologically enlarged axillary or mediastinal lymph nodes. The
trachea esophagus are grossly unremarkable.

Lungs/Pleura: Decreased size of the small left upper lobe pulmonary
nodule now measuring 3 mm on image 36/5, previously 6 mm. Unchanged
size of the right middle lobe nodule measuring 5 mm on image 111/5.
Unchanged size of the 6 mm nodular focus along the right major
fissure likely intra fissural lymph node on image 95/5. Unchanged 3
mm triangular nodule along the right major fissure on image 93/5
likely a perifissural lymph node as well. Unchanged size of the 2 mm
subpleural left upper lobe nodule on image 46/5. Unchanged size of
the 3 mm triangular perifissural nodule along left major fissure on
image 71/5 likely a perifissural lymph node. No new or enlarging
suspicious pulmonary nodules or masses.

Upper Abdomen: Similar eventration of the left hemidiaphragm.
Multiple hepatic cysts. No acute abnormality.

Musculoskeletal: Prior median sternotomy. No acute osseous
abnormality.
IMPRESSION: 1. Decreased size of the left upper lobe pulmonary nodule, favor
benign post infectious/inflammatory. Additional small bilateral
pulmonary nodules are unchanged, and also favored benign. No new or
enlarging suspicious pulmonary nodules or masses.
2. Unchanged aneurysmal dilation of the ascending aorta measuring up
to 4.8 cm in maximum axial diameter.

## 2022-11-18 ENCOUNTER — Ambulatory Visit: Payer: 59 | Admitting: Internal Medicine

## 2022-11-18 ENCOUNTER — Encounter: Payer: Self-pay | Admitting: Internal Medicine

## 2022-11-18 VITALS — BP 122/62 | HR 54 | Temp 97.9°F | Resp 16 | Ht 73.0 in | Wt 198.1 lb

## 2022-11-18 DIAGNOSIS — I10 Essential (primary) hypertension: Secondary | ICD-10-CM | POA: Diagnosis not present

## 2022-11-18 DIAGNOSIS — Z0000 Encounter for general adult medical examination without abnormal findings: Secondary | ICD-10-CM | POA: Diagnosis not present

## 2022-11-18 DIAGNOSIS — Z1211 Encounter for screening for malignant neoplasm of colon: Secondary | ICD-10-CM

## 2022-11-18 DIAGNOSIS — E785 Hyperlipidemia, unspecified: Secondary | ICD-10-CM

## 2022-11-18 DIAGNOSIS — R972 Elevated prostate specific antigen [PSA]: Secondary | ICD-10-CM

## 2022-11-18 LAB — CBC WITH DIFFERENTIAL/PLATELET
Basophils Absolute: 0.1 10*3/uL (ref 0.0–0.1)
Basophils Relative: 1.3 % (ref 0.0–3.0)
Eosinophils Absolute: 0.3 10*3/uL (ref 0.0–0.7)
Eosinophils Relative: 5 % (ref 0.0–5.0)
HCT: 44.6 % (ref 39.0–52.0)
Hemoglobin: 14.8 g/dL (ref 13.0–17.0)
Lymphocytes Relative: 26.8 % (ref 12.0–46.0)
Lymphs Abs: 1.4 10*3/uL (ref 0.7–4.0)
MCHC: 33.2 g/dL (ref 30.0–36.0)
MCV: 94.1 fl (ref 78.0–100.0)
Monocytes Absolute: 0.5 10*3/uL (ref 0.1–1.0)
Monocytes Relative: 8.5 % (ref 3.0–12.0)
Neutro Abs: 3.1 10*3/uL (ref 1.4–7.7)
Neutrophils Relative %: 58.4 % (ref 43.0–77.0)
Platelets: 272 10*3/uL (ref 150.0–400.0)
RBC: 4.74 Mil/uL (ref 4.22–5.81)
RDW: 14.6 % (ref 11.5–15.5)
WBC: 5.3 10*3/uL (ref 4.0–10.5)

## 2022-11-18 LAB — COMPREHENSIVE METABOLIC PANEL
ALT: 13 U/L (ref 0–53)
AST: 11 U/L (ref 0–37)
Albumin: 4.2 g/dL (ref 3.5–5.2)
Alkaline Phosphatase: 38 U/L — ABNORMAL LOW (ref 39–117)
BUN: 16 mg/dL (ref 6–23)
CO2: 31 mEq/L (ref 19–32)
Calcium: 9.5 mg/dL (ref 8.4–10.5)
Chloride: 102 mEq/L (ref 96–112)
Creatinine, Ser: 1.06 mg/dL (ref 0.40–1.50)
GFR: 75.69 mL/min (ref >60.00)
Glucose, Bld: 109 mg/dL — ABNORMAL HIGH (ref 70–99)
Potassium: 4.5 mEq/L (ref 3.5–5.1)
Sodium: 139 mEq/L (ref 135–145)
Total Bilirubin: 0.9 mg/dL (ref 0.2–1.2)
Total Protein: 6.4 g/dL (ref 6.0–8.3)

## 2022-11-18 LAB — LIPID PANEL
Cholesterol: 213 mg/dL — ABNORMAL HIGH (ref 0–200)
HDL: 35.8 mg/dL — ABNORMAL LOW (ref >39.00)
LDL Cholesterol: 150 mg/dL — ABNORMAL HIGH (ref 0–99)
NonHDL: 177.63
Total CHOL/HDL Ratio: 6
Triglycerides: 139 mg/dL (ref 0.0–149.0)
VLDL: 27.8 mg/dL (ref 0.0–40.0)

## 2022-11-18 LAB — TSH: TSH: 1.47 u[IU]/mL (ref 0.35–5.50)

## 2022-11-18 LAB — PSA: PSA: 4.73 ng/mL — ABNORMAL HIGH (ref 0.10–4.00)

## 2022-11-18 MED ORDER — CARVEDILOL 25 MG PO TABS: 25.0000 mg | ORAL_TABLET | Freq: Two times a day (BID) | ORAL | 3 refills | Status: AC

## 2022-11-18 NOTE — Patient Instructions (Addendum)
Vaccines I recommend: Covid booster RSV vaccine Flu shot this fall  Continue checking your blood pressures regularly BP GOAL is between 110/65 and  135/85. If it is consistently higher or lower, let me know    HOW TO TAKE YOUR BLOOD PRESSURE:   Rest 5 minutes before taking your blood pressure.   Don't smoke or drink caffeinated beverages for at least 30 minutes before.   Take your blood pressure before (not after) you eat.   Sit comfortably with your back supported and both feet on the floor (don't cross your legs).   Elevate your arm to heart level on a table or a desk.   Use the proper sized cuff. It should fit smoothly and snugly around your bare upper arm. There should be enough room to slip a fingertip under the cuff. The bottom edge of the cuff should be 1 inch above the crease of the elbow.   Ideally, take 3 measurements at one sitting and record the average.    GO TO THE LAB : Get the blood work     GO TO THE FRONT DESK, PLEASE SCHEDULE YOUR APPOINTMENTS Come back for a physical exam in 1 year.   Please bring or send [place] a copy of your Healthcare Power of Attorney for your chart.

## 2022-11-18 NOTE — Progress Notes (Signed)
Subjective:    Patient ID: Nicholas Carrillo, male    DOB: 1960-11-07, 62 y.o.   MRN: [id number]  DOS:  11/18/2022 Type of visit - description: cpx  Here for CPX.  Doing well. Has no concerns  Review of Systems See above   Past Medical History:  Diagnosis Date   Increased prostate specific antigen (PSA) velocity    prostate BX 3-11 (atypical cells) and 08-2009 negative   Multiple lipomas     Past Surgical History:  Procedure Laterality Date   CLOSED MANIPULATION SHOULDER Right 09/24/10   COLONOSCOPY  09/22/2012   polyp BUT not pre-cancerous recall 2024   HEMANGIOMA EXCISION  1972   located at the thimus, had a sternotomy   KNEE SURGERY  1979   tumor not w/i the joint   NASAL SEPTUM SURGERY  1980   PROSTATE BIOPSY     x 2 ---> 2011    Current Outpatient Medications  Medication Instructions   carvedilol (COREG) 18.75 mg, Oral, 2 times daily with meals   sildenafil (REVATIO) 20 MG tablet TAKE 3 TO 4 TABLETS (60-80MG ) BY MOUTH DAILY AS NEEDED.       Objective:   Physical Exam BP 122/62   Pulse (!) 54   Temp 97.9 F (36.6 C) (Oral)   Resp 16   Ht 6\' 1"  (1.854 m)   Wt 198 lb 2 oz (89.9 kg)   SpO2 97%   BMI 26.14 kg/m  General: Well developed, NAD, BMI noted Neck: No  thyromegaly  HEENT:  Normocephalic . Face symmetric, atraumatic Lungs:  CTA B Normal respiratory effort, no intercostal retractions, no accessory muscle use. Heart: RRR,  no murmur.  Abdomen:  Not distended, soft, non-tender. No rebound or rigidity.   Lower extremities: no pretibial edema bilaterally  DRE: Normal sphincter tone, no stools, prostate is moderately enlarged, nontender-not nodular. Skin: Exposed areas without rash. Not pale. Not jaundice Neurologic:  alert & oriented X3.  Speech normal, gait appropriate for age and unassisted Strength symmetric and appropriate for age.  Psych: Cognition and judgment appear intact.  Cooperative with normal attention span and concentration.   Behavior appropriate. No anxious or depressed appearing.     Assessment     Assessment Increased PSA, BX 06-2009: atypical cells. BX again 08-2009 (-) Abnormal CT chest: --Nodules  : Stable per CT 2013, no further CTs if risk is low  --Elevated diaphragma, chronic --Ascending aortic aneurysm- f/u thoracic surgery Lipomas, multiple, had 2 biopsies when he was a teenager, negative ED   PLAN: Here for CPX -Td 2021 - shingrix s/p 2 - vaccines: RSV, covid booster, flu shot q year - CCS: colonoscopy 2014, next 10 years, referral sent. -Prostate cancer screening:  Saw urology 01-2020 for follow-up on elevated PSA, PSA was noted to be back to baseline at 2.0.  Does not plan to see urology again.  DRE: Enlarged prostate, perhaps slightly larger than last year.  No symptoms, check PSA -Labs: CMP FLP CBC TSH PSA -Diet and exercise: Counseled  -POA: Recommend to bring a copy  Ascending aortic aneurysm: LOV thoracic surgery 09/24/2022 HTN: Saw Melissa 09/19/2022, ambulatory BPs were high here however his BP cuff was possibly not accurate. Metoprolol was switch to carvedilol 12.5 mg: 1.5 tab twice daily, was unable to cut the tablets so he is taking 2 tabs bid. Amb BPs the great majority of x 115, 120.  Very seldom 145.  Heart rate never less than 60. Plan: No change, Rx carvedilol  25 mg twice daily  RTC 1 year.  Sooner if needed === Mild HTN: BP at home is consistently 150s, today is ok but has been elevated at other visits. He has a ascending aortic aneurysm thus rec better BPs. Plan: Start metoprolol XL 25, monitor BPs. Rash: See last visit, saw ID.  Symptoms resolved, will get blood work as requested by ID. Ascending aortic aneurysm: Last visit with cardiovascular 08/31/2021, next visit 1 year.  See comments under HTN RTC 1 year as long as he feels well BP is controlled.

## 2022-12-22 ENCOUNTER — Encounter: Payer: Self-pay | Admitting: Internal Medicine

## 2022-12-24 ENCOUNTER — Encounter: Payer: Self-pay | Admitting: Internal Medicine

## 2023-02-09 ENCOUNTER — Ambulatory Visit (AMBULATORY_SURGERY_CENTER): Payer: 59

## 2023-02-09 VITALS — Ht 73.0 in | Wt 195.0 lb

## 2023-02-09 DIAGNOSIS — Z1211 Encounter for screening for malignant neoplasm of colon: Secondary | ICD-10-CM

## 2023-02-09 NOTE — Progress Notes (Signed)

## 2023-03-03 ENCOUNTER — Encounter: Payer: Self-pay | Admitting: Internal Medicine

## 2023-03-15 NOTE — Progress Notes (Unsigned)
[facility] History and Physical   Primary Care Physician:  Wanda Plump, MD   Reason for Procedure:   CRCA screening  Plan:    colonoscopy     HPI: Nicholas Carrillo is a 62 y.o. male here for screening colonoscopy exam. He is s/p negative exam 2014.   Past Medical History:  Diagnosis Date   Hypertension    Increased prostate specific antigen (PSA) velocity    prostate BX 3-11 (atypical cells) and 08-2009 negative   Multiple lipomas     Past Surgical History:  Procedure Laterality Date   CLOSED MANIPULATION SHOULDER Right 09/24/10   COLONOSCOPY  09/22/2012   polyp BUT not pre-cancerous recall 2024   HEMANGIOMA EXCISION  1972   located at the thimus, had a sternotomy   KNEE SURGERY  1979   tumor not w/i the joint   NASAL SEPTUM SURGERY  1980   PROSTATE BIOPSY     x 2 ---> 2011    Prior to Admission medications   Medication Sig Start Date End Date Taking? Authorizing Provider  carvedilol (COREG) 25 MG tablet Take 1 tablet (25 mg total) by mouth 2 (two) times daily with a meal. 11/18/22   Wanda Plump, MD  sertraline (ZOLOFT) 50 MG tablet Take by mouth. 12/23/22   [provider]  sildenafil (REVATIO) 20 MG tablet TAKE 3 TO 4 TABLETS (60-80MG ) BY MOUTH DAILY AS NEEDED. 11/18/21   Wanda Plump, MD    Current Outpatient Medications  Medication Sig Dispense Refill   carvedilol (COREG) 25 MG tablet Take 1 tablet (25 mg total) by mouth 2 (two) times daily with a meal. 180 tablet 3   sertraline (ZOLOFT) 50 MG tablet Take by mouth.     sildenafil (REVATIO) 20 MG tablet TAKE 3 TO 4 TABLETS (60-80MG ) BY MOUTH DAILY AS NEEDED. 30 tablet 5   No current facility-administered medications for this visit.    Allergies as of 03/16/2023   (No Known Allergies)    Family History  Adopted: Yes    Social History   Socioeconomic History   Marital status: Married    Spouse name: Not on file   Number of children: 2   Years of education: Not on file   Highest  education level: Not on file  Occupational History   Occupation: Attorney    Employer: [employer]  Tobacco Use   Smoking status: Never   Smokeless tobacco: Never  Substance and Sexual Activity   Alcohol use: Yes    Alcohol/week: 3.0 standard drinks of alcohol    Types: 3 Glasses of wine per week   Drug use: No   Sexual activity: Not on file  Other Topics Concern   Not on file  Social History Narrative   Unknown Family Hx-Adopted.   Occupation: Pensions consultant   Married   Daughter 2001 [employer]   Son , Cornelius Moras 2005   Diet--try to eat healthy      [place] doing charitable work often   L[place] part-time in [place]   EtOH 5/week, no smoking/substances      Social Determinants of Corporate investment banker Strain: Not on [employer] Insecurity: Not on file  Transportation Needs: Not on file  Physical Activity: Not on file  Stress: Not on file  Social Connections: Not on file  Intimate Partner Violence: Not on file    Review of Systems: Positive for *** All other review of systems  negative except as mentioned in the HPI.  Physical Exam: Vital signs There were no vitals taken for this visit.  General:   Alert,  Well-developed, well-nourished, pleasant and cooperative in NAD Lungs:  Clear throughout to auscultation.   Heart:  Regular rate and rhythm; no murmurs, clicks, rubs,  or gallops. Abdomen:  Soft, nontender and nondistended. Normal bowel sounds.   Neuro/Psych:  Alert and cooperative. Normal mood and affect. A and O x 3   @Blaze Nylund  Sena Slate, MD, [facility] [phone] (pager) 03/15/2023 8:26 PM@

## 2023-03-16 ENCOUNTER — Encounter: Payer: Self-pay | Admitting: Internal Medicine

## 2023-03-16 ENCOUNTER — Ambulatory Visit: Payer: 59 | Admitting: Internal Medicine

## 2023-03-16 VITALS — BP 108/70 | HR 59 | Temp 97.8°F | Resp 16 | Ht 73.0 in | Wt 195.0 lb

## 2023-03-16 DIAGNOSIS — Z1211 Encounter for screening for malignant neoplasm of colon: Secondary | ICD-10-CM

## 2023-03-16 DIAGNOSIS — K635 Polyp of colon: Secondary | ICD-10-CM

## 2023-03-16 DIAGNOSIS — D123 Benign neoplasm of transverse colon: Secondary | ICD-10-CM

## 2023-03-16 DIAGNOSIS — K5730 Diverticulosis of large intestine without perforation or abscess without bleeding: Secondary | ICD-10-CM

## 2023-03-16 MED ORDER — SODIUM CHLORIDE 0.9 % IV SOLN: 500.0000 mL | Freq: Once | INTRAVENOUS | Status: DC

## 2023-03-16 NOTE — Progress Notes (Signed)
Called to room to assist during endoscopic procedure.  Patient ID and intended procedure confirmed with present staff. Received instructions for my participation in the procedure from the performing physician.  

## 2023-03-16 NOTE — Op Note (Signed)
[facility] Patient Name: Nicholas Carrillo Procedure Date: 03/16/2023 7:59 AM MRN: [id number] Endoscopist: Iva Boop , MD, [id number] Age: 62 Referring MD:  Date of Birth: March 01, 1961 Gender: Male Account #: [id number] Procedure:                Colonoscopy Indications:              Screening for colorectal malignant neoplasm, Last                            colonoscopy: 2014 Medicines:                Monitored Anesthesia Care Procedure:                Pre-Anesthesia Assessment:                           - Prior to the procedure, a History and Physical                            was performed, and patient medications and                            allergies were reviewed. The patient's tolerance of                            previous anesthesia was also reviewed. The risks                            and benefits of the procedure and the sedation                            options and risks were discussed with the patient.                            All questions were answered, and informed consent                            was obtained. Prior Anticoagulants: The patient has                            taken no anticoagulant or antiplatelet agents. ASA                            Grade Assessment: II - A patient with mild systemic                            disease. After reviewing the risks and benefits,                            the patient was deemed in satisfactory condition to                            undergo the procedure.  After obtaining informed consent, the colonoscope                            was passed under direct vision. Throughout the                            procedure, the patient's blood pressure, pulse, and                            oxygen saturations were monitored continuously. The                            CF [id number] #[id number] was introduced through the anus                            and advanced to the the cecum, identified  by                            appendiceal orifice and ileocecal valve. The                            colonoscopy was performed without difficulty. The                            patient tolerated the procedure well. The quality                            of the bowel preparation was good. The ileocecal                            valve, appendiceal orifice, and rectum were                            photographed. The bowel preparation used was                            Miralax via split dose instruction. Scope In: 8:06:05 AM Scope Out: 8:19:14 AM Scope Withdrawal Time: 0 hours 10 minutes 7 seconds  Total Procedure Duration: 0 hours 13 minutes 9 seconds  Findings:                 The perianal and digital rectal examinations were                            normal. Pertinent negatives include normal prostate                            (size, shape, and consistency).                           A 1 to 2 mm polyp was found in the transverse                            colon. The polyp was sessile. The polyp was removed  with a cold biopsy forceps. Resection and retrieval                            were complete. Verification of patient                            identification for the specimen was done. Estimated                            blood loss was minimal.                           Multiple diverticula were found in the sigmoid                            colon.                           The exam was otherwise without abnormality on                            direct and retroflexion views. Complications:            No immediate complications. Estimated Blood Loss:     Estimated blood loss was minimal. Impression:               - One 1 to 2 mm polyp in the transverse colon,                            removed with a cold biopsy forceps. Resected and                            retrieved.                           - Diverticulosis in the sigmoid colon.                            - The examination was otherwise normal on direct                            and retroflexion views. Recommendation:           - Patient has a contact number available for                            emergencies. The signs and symptoms of potential                            delayed complications were discussed with the                            patient. Return to normal activities tomorrow.                            Written discharge instructions were provided to the  patient.                           - Resume previous diet.                           - Continue present medications.                           - Repeat colonoscopy is recommended. The                            colonoscopy date will be determined after pathology                            results from today's exam become available for                            review. Iva Boop, MD 03/16/2023 8:26:45 AM This report has been signed electronically.

## 2023-03-16 NOTE — Progress Notes (Signed)
Pt's states no medical or surgical changes since previsit or office visit. 

## 2023-03-16 NOTE — Patient Instructions (Addendum)
I found and removed one tiny polyp.  You also have a condition called diverticulosis - common and not usually a problem. Please read the handout provided.  I will let you know pathology results and when to have another routine colonoscopy by mail and/or My Chart.  I appreciate the opportunity to care for you. Iva Boop, MD, FACG   YOU HAD AN ENDOSCOPIC PROCEDURE TODAY AT THE [facility]:   Refer to the procedure report that was given to you for any specific questions about what was found during the examination.  If the procedure report does not answer your questions, please call your gastroenterologist to clarify.  If you requested that your care partner not be given the details of your procedure findings, then the procedure report has been included in a sealed envelope for you to review at your convenience later.  YOU SHOULD EXPECT: Some feelings of bloating in the abdomen. Passage of more gas than usual.  Walking can help get rid of the air that was put into your GI tract during the procedure and reduce the bloating. If you had a lower endoscopy (such as a colonoscopy or flexible sigmoidoscopy) you may notice spotting of blood in your stool or on the toilet paper. If you underwent a bowel prep for your procedure, you may not have a normal bowel movement for a few days.  Please Note:  You might notice some irritation and congestion in your nose or some drainage.  This is from the oxygen used during your procedure.  There is no need for concern and it should clear up in a day or so.  SYMPTOMS TO REPORT IMMEDIATELY:  Following lower endoscopy (colonoscopy or flexible sigmoidoscopy):  Excessive amounts of blood in the stool  Significant tenderness or worsening of abdominal pains  Swelling of the abdomen that is new, acute  Fever of 100F or higher    For urgent or emergent issues, a gastroenterologist can be reached at any hour by calling [phone]. Do not use MyChart  messaging for urgent concerns.    DIET:  We do recommend a small meal at first, but then you may proceed to your regular diet.  Drink plenty of fluids but you should avoid alcoholic beverages for 24 hours.  ACTIVITY:  You should plan to take it easy for the rest of today and you should NOT DRIVE or use heavy machinery until tomorrow (because of the sedation medicines used during the test).    FOLLOW UP: Our staff will call the number listed on your records the next business day following your procedure.  We will call around 7:15- 8:00 am to check on you and address any questions or concerns that you may have regarding the information given to you following your procedure. If we do not reach you, we will leave a message.     If any biopsies were taken you will be contacted by phone or by letter within the next 1-3 weeks.  Please call us at [phone] if you have not heard about the biopsies in 3 weeks.    SIGNATURES/CONFIDENTIALITY: You and/or your care partner have signed paperwork which will be entered into your electronic medical record.  These signatures attest to the fact that that the information above on your After Visit Summary has been reviewed and is understood.  Full responsibility of the confidentiality of this discharge information lies with you and/or your care-partner.

## 2023-03-16 NOTE — Progress Notes (Signed)
Vss nad trans to pacu 

## 2023-03-17 ENCOUNTER — Telehealth: Payer: Self-pay | Admitting: *Deleted

## 2023-03-17 NOTE — Telephone Encounter (Signed)
  Follow up Call-     03/16/2023    7:16 AM  Call back number  Post procedure Call Back phone  # [phone]  Permission to leave phone message Yes     Patient questions:  Do you have a fever, pain , or abdominal swelling? No. Pain Score  0 *  Have you tolerated food without any problems? Yes.    Have you been able to return to your normal activities? Yes.    Do you have any questions about your discharge instructions: Diet   No. Medications  No. Follow up visit  No.  Do you have questions or concerns about your Care? No.  Actions: * If pain score is 4 or above: No action needed, pain <4.

## 2023-03-20 LAB — SURGICAL PATHOLOGY

## 2023-03-26 ENCOUNTER — Encounter: Payer: Self-pay | Admitting: Internal Medicine

## 2023-05-24 ENCOUNTER — Encounter: Payer: Self-pay | Admitting: Internal Medicine

## 2023-09-07 ENCOUNTER — Other Ambulatory Visit: Payer: Self-pay | Admitting: Surgery

## 2023-09-07 DIAGNOSIS — I7121 Aneurysm of the ascending aorta, without rupture: Secondary | ICD-10-CM

## 2023-09-15 NOTE — Patient Instructions (Incomplete)
Patient is counseled regarding the importance of long term risk factor modification as they pertain to the presence of ischemic heart disease including avoiding the use of all tobacco products, dietary modifications and medical therapy for diabetes, cholesterol and lipid management, and regular exercise.

## 2023-09-15 NOTE — Progress Notes (Deleted)
      [address]       [place] [address]             [phone]        [name] [id number] March 22, 1961  History of Present Illness:  Nicholas Carrillo is a 63 year old gentleman with known history of HTN.  He is followed with a 4.8 cm fusiform ascending aortic aneurysm. This was measured at 4.5 cm in 2016. Echocardiogram in 2021 showed a trileaflet aortic valve with no regurgitation or stenosis. Left ventricular ejection fraction was 50 to 55% with mild LVH and grade 1 diastolic dysfunction.      Current Outpatient Medications on File Prior to Visit  Medication Sig Dispense Refill   carvedilol  (COREG ) 25 MG tablet Take 1 tablet (25 mg total) by mouth 2 (two) times daily with a meal. 180 tablet 3   sertraline (ZOLOFT) 50 MG tablet Take by mouth.     sildenafil  (REVATIO ) 20 MG tablet TAKE 3 TO 4 TABLETS (60-80MG ) BY MOUTH DAILY AS NEEDED. 30 tablet 5   No current facility-administered medications on file prior to visit.     There were no vitals taken for this visit.  Physical Exam  CTA Results:      A/P:  Ascending Aortic Aneurysm HTN- RTC in     Risk Modification:  Statin:  No, defer to PCP  Smoking cessation instruction/counseling given:  {CHL AMB [facility] SMOKING CESSATION COUNSELING:20758}  Patient was counseled on importance of Blood Pressure Control.  Despite Medical intervention if the patient notices persistently elevated blood pressure readings.  They are instructed to contact their Primary Care Physician  Please avoid use of Fluoroquinolones as this can potentially increase your risk of Aortic Rupture and/or Dissection  Patient educated on signs and symptoms of Aortic Dissection, handout also provided in AVS  Nicholas Wilhelmi, PA-C 09/15/23

## 2023-09-29 ENCOUNTER — Ambulatory Visit

## 2023-11-14 IMAGING — CT CT ANGIO CHEST
2 of 7 series · 17 of 36 positions shown · IV contrast (agent unspecified)
Comparison: 08/24/2020, 12/12/2019

CLINICAL DATA: Known thoracic aortic aneurysm

EXAM:
CT ANGIOGRAPHY CHEST WITH CONTRAST
TECHNIQUE: Multidetector CT imaging of the chest was performed using the
standard protocol during bolus administration of intravenous
contrast. Multiplanar CT image reconstructions and MIPs were
obtained to evaluate the vascular anatomy.

[Series 10: cta thorax 1.00 bv36 s3 arterial thins · axial · arterial · 0.76mm/px · z∈[+1523,+1848]mm · 16 of 616 slices shown]
[im 37/616  lung]
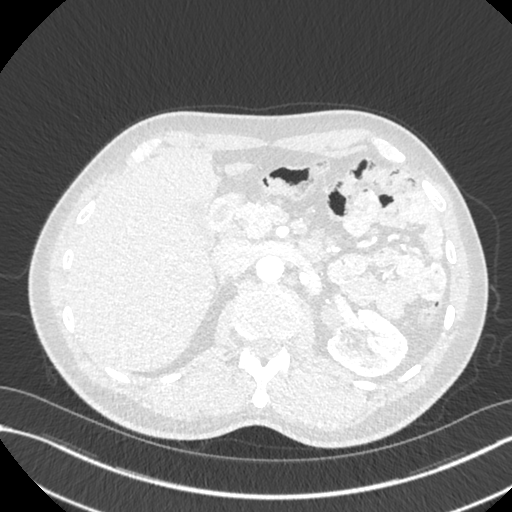
[im 73/616  mediastinal]
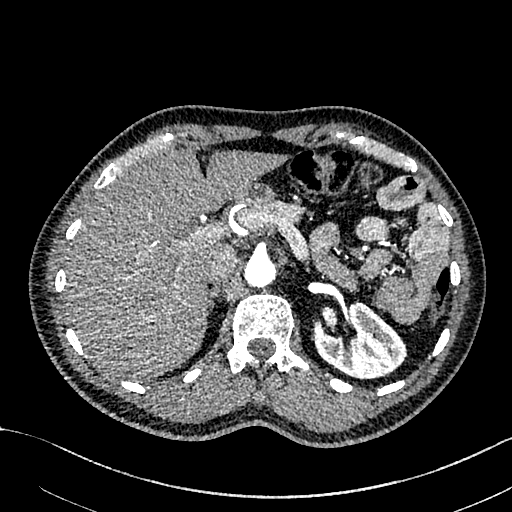
[im 109/616  lung]
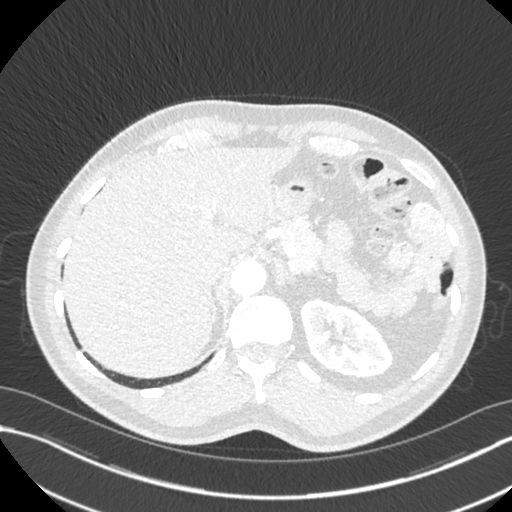
[im 145/616  mediastinal]
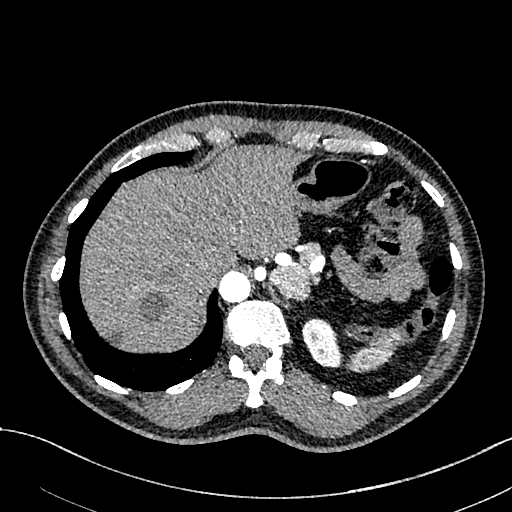
[im 181/616  lung]
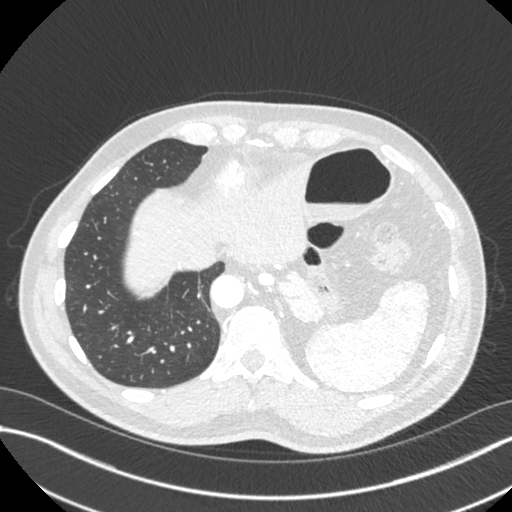
[im 218/616  mediastinal]
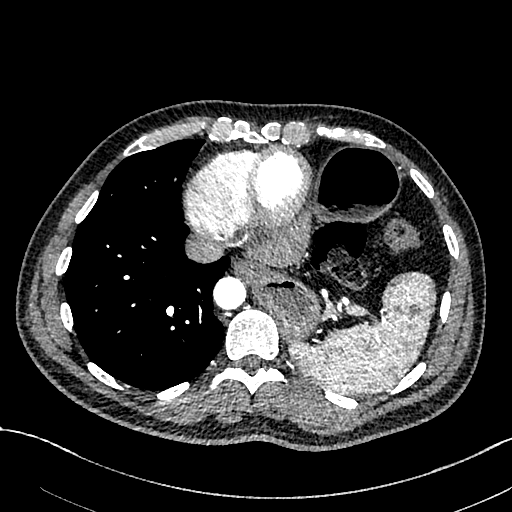
[im 254/616  lung]
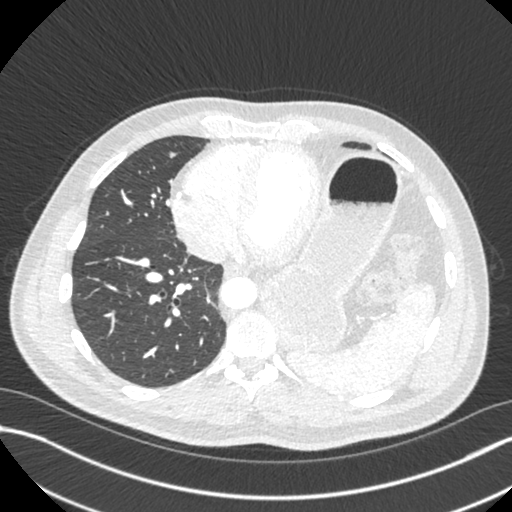
[im 290/616  mediastinal]
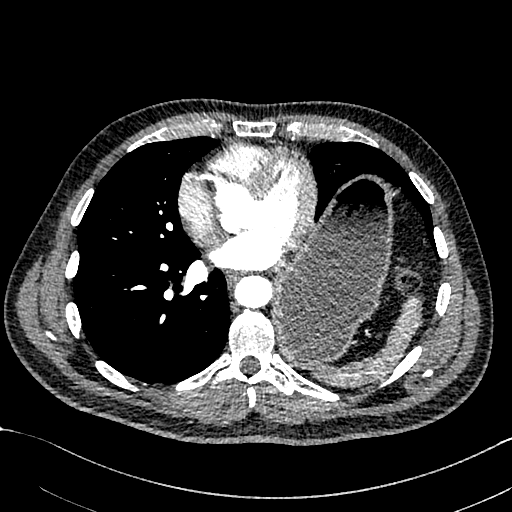
[im 326/616  lung]
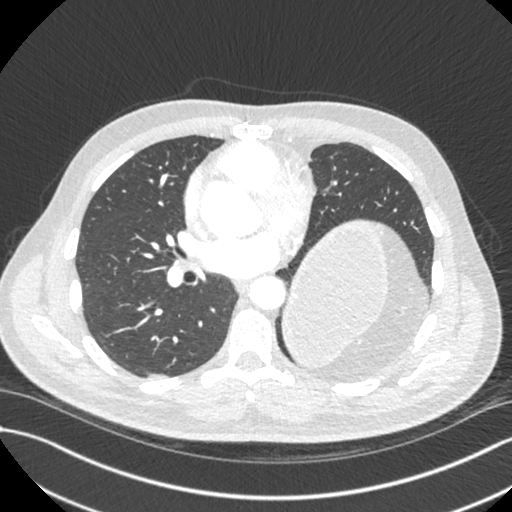
[im 362/616  mediastinal]
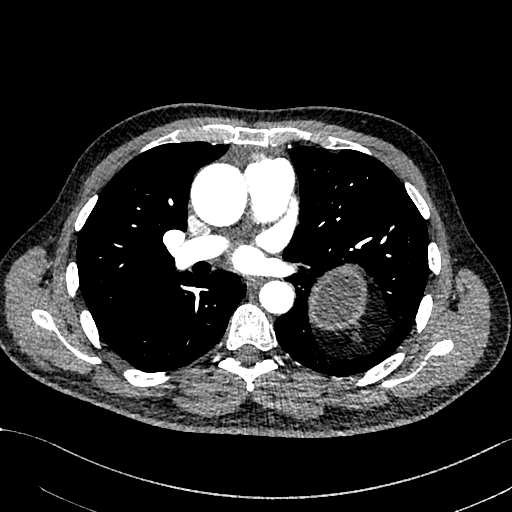
[im 398/616  lung]
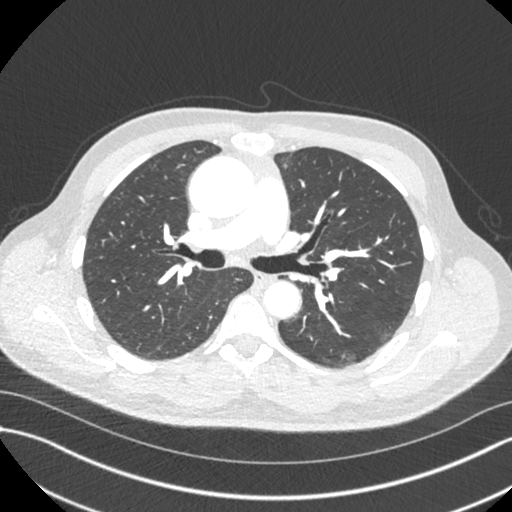
[im 435/616  mediastinal]
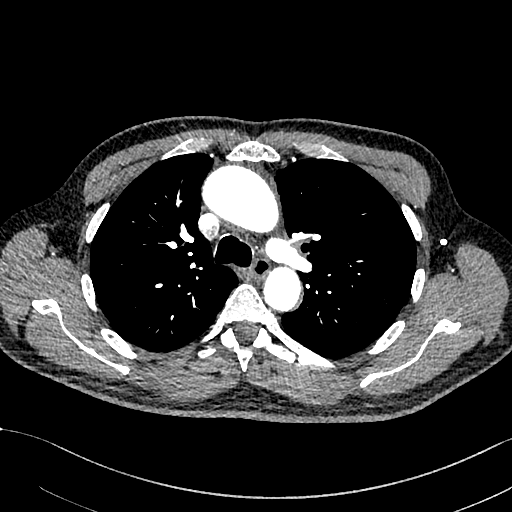
[im 471/616  lung]
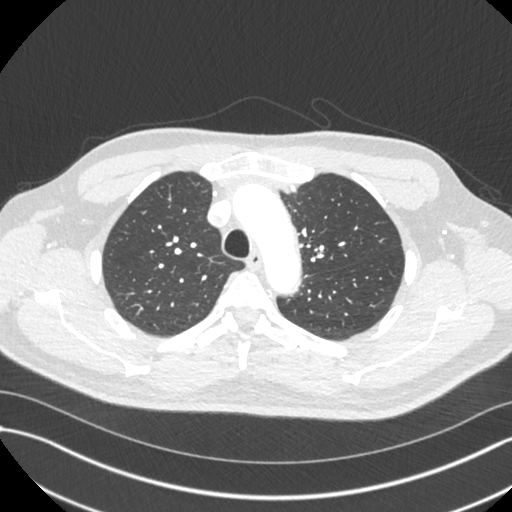
[im 507/616  mediastinal]
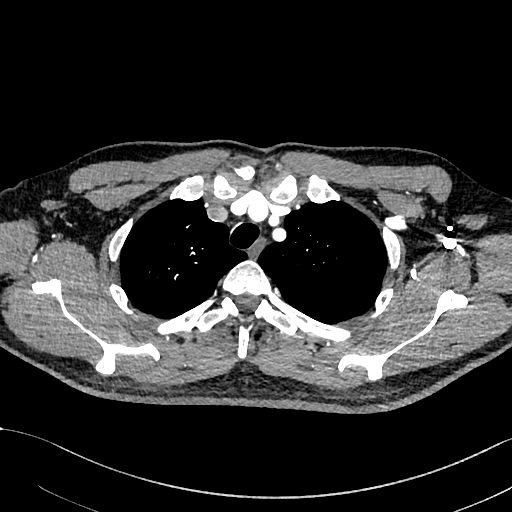
[im 543/616  lung]
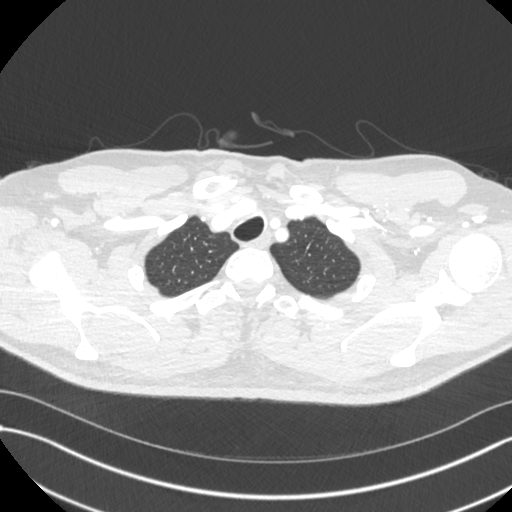
[im 579/616  mediastinal]
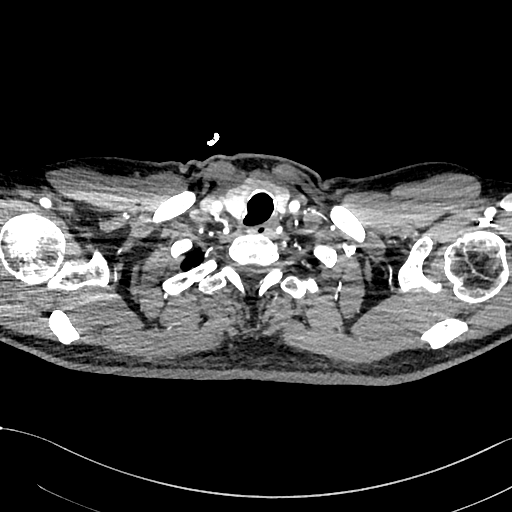

[Series 11: cta thorax 2.00 bv36 s3 cor st · coronal · 0.73mm/px · 1 of 133 slices shown]
[im 67/133  mediastinal]
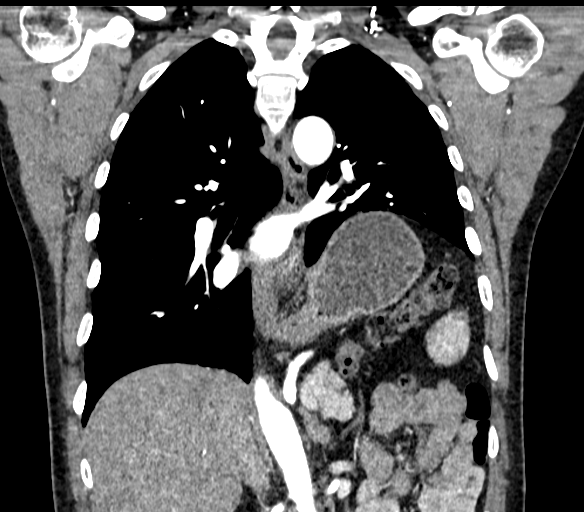

[17 of 36 positions shown; findings below may reference images not displayed]

RADIATION DOSE REDUCTION: This exam was performed according to the
departmental dose-optimization program which includes automated
exposure control, adjustment of the mA and/or kV according to
patient size and/or use of iterative reconstruction technique.

CONTRAST:  75mL KLQO4P-LVE IOPAMIDOL (KLQO4P-LVE) INJECTION 76%
FINDINGS: Cardiovascular: There is a stable 4.8 cm ascending thoracic aortic
aneurysm, without evidence of dissection.

The heart is unremarkable without pericardial effusion. There is
adequate opacification of the pulmonary vasculature. No filling
defects or pulmonary emboli.

Mediastinum/Nodes: No enlarged mediastinal, hilar, or axillary lymph
nodes. Thyroid gland, trachea, and esophagus demonstrate no
significant findings.

Lungs/Pleura: Chronic elevation of the left hemidiaphragm. No acute
airspace disease, effusion, or pneumothorax. Multiple pulmonary
nodules are again identified:

Left upper lobe, solid nodule, image 27/18, 6 x 6 mm. Increased
since 08/24/2020 but stable since 12/12/2019.

Left lower lobe, solid nodule, image 36/8, 4 mm.  Stable.

Right middle lobe, solid nodule, image 108/8, 4 mm.  Stable.

There are multiple sub 4 mm Peri fissural lymph nodes seen along the
major fissures, stable. Central airways are patent.

Upper Abdomen: Stable hepatic cysts. No acute upper abdominal
findings.

Musculoskeletal: No acute or destructive bony lesions. Reconstructed
images demonstrate no additional findings.

Review of the MIP images confirms the above findings.
IMPRESSION: 1. Stable 4.8 cm ascending thoracic aortic aneurysm. Ascending
thoracic aortic aneurysm. Recommend semi-annual imaging followup by
CTA or MRA and referral to cardiothoracic surgery if not already
obtained. This recommendation follows 4858
ACCF/AHA/AATS/ACR/ASA/SCA/[place]STENE/SANCHEZ/[place] Guidelines for the
Diagnosis and Management of Patients With Thoracic Aortic Disease.
Circulation. 4858; 121: E266-e369. Aortic aneurysm NOS ([id number]-7U9.A)
2. Waxing and waning appearance to a 6 mm left upper lobe pulmonary
nodule, similar in appearance to 12/12/2019 exam. Remaining
pulmonary nodules and Peri fissural lymph nodes are stable. Given
long-term stability, these are consistent with benign nodules and no
specific imaging follow-up is required.

## 2023-11-23 ENCOUNTER — Encounter: Payer: 59 | Admitting: Internal Medicine
